# Patient Record
Sex: Female | Born: 1995 | Race: White | Hispanic: No | Marital: Single | Smoking: Former smoker
Health system: Southern US, Community
[De-identification: ages and names within clinical notes are randomized; demographics above are authoritative.]

## PROBLEM LIST (undated history)

## (undated) DIAGNOSIS — N390 Urinary tract infection, site not specified: Secondary | ICD-10-CM

## (undated) DIAGNOSIS — F1991 Other psychoactive substance use, unspecified, in remission: Secondary | ICD-10-CM

## (undated) DIAGNOSIS — B1920 Unspecified viral hepatitis C without hepatic coma: Secondary | ICD-10-CM

## (undated) DIAGNOSIS — Z87898 Personal history of other specified conditions: Secondary | ICD-10-CM

## (undated) HISTORY — DX: Personal history of other specified conditions: Z87.898

## (undated) HISTORY — DX: Other psychoactive substance use, unspecified, in remission: F19.91

## (undated) HISTORY — PX: SHOULDER SURGERY: SHX246

## (undated) HISTORY — DX: Urinary tract infection, site not specified: N39.0

---

## 2020-02-09 ENCOUNTER — Encounter (HOSPITAL_COMMUNITY): Payer: Self-pay

## 2020-02-09 ENCOUNTER — Other Ambulatory Visit: Payer: Self-pay

## 2020-02-09 ENCOUNTER — Emergency Department (HOSPITAL_COMMUNITY): Payer: Medicaid Other

## 2020-02-09 ENCOUNTER — Emergency Department (HOSPITAL_COMMUNITY)
Admission: EM | Admit: 2020-02-09 | Discharge: 2020-02-09 | Disposition: A | Discharge status: Home / Self Care | Payer: Medicaid Other | Attending: Emergency Medicine | Admitting: Emergency Medicine

## 2020-02-09 DIAGNOSIS — R102 Pelvic and perineal pain: Secondary | ICD-10-CM | POA: Diagnosis not present

## 2020-02-09 DIAGNOSIS — O99331 Smoking (tobacco) complicating pregnancy, first trimester: Secondary | ICD-10-CM | POA: Diagnosis not present

## 2020-02-09 DIAGNOSIS — F17210 Nicotine dependence, cigarettes, uncomplicated: Secondary | ICD-10-CM | POA: Diagnosis not present

## 2020-02-09 DIAGNOSIS — Z3A01 Less than 8 weeks gestation of pregnancy: Secondary | ICD-10-CM | POA: Insufficient documentation

## 2020-02-09 DIAGNOSIS — O99891 Other specified diseases and conditions complicating pregnancy: Secondary | ICD-10-CM | POA: Insufficient documentation

## 2020-02-09 DIAGNOSIS — N899 Noninflammatory disorder of vagina, unspecified: Secondary | ICD-10-CM | POA: Diagnosis not present

## 2020-02-09 DIAGNOSIS — R1033 Periumbilical pain: Secondary | ICD-10-CM | POA: Insufficient documentation

## 2020-02-09 DIAGNOSIS — O26891 Other specified pregnancy related conditions, first trimester: Secondary | ICD-10-CM | POA: Insufficient documentation

## 2020-02-09 HISTORY — DX: Unspecified viral hepatitis C without hepatic coma: B19.20

## 2020-02-09 LAB — CBC
HCT: 41.3 % (ref 36.0–46.0)
Hemoglobin: 13.7 g/dL (ref 12.0–15.0)
MCH: 31.2 pg (ref 26.0–34.0)
MCHC: 33.2 g/dL (ref 30.0–36.0)
MCV: 94.1 fL (ref 80.0–100.0)
Platelets: 244 10*3/uL (ref 150–400)
RBC: 4.39 MIL/uL (ref 3.87–5.11)
RDW: 12.9 % (ref 11.5–15.5)
WBC: 6.7 10*3/uL (ref 4.0–10.5)
nRBC: 0 % (ref 0.0–0.2)

## 2020-02-09 LAB — WET PREP, GENITAL
Clue Cells Wet Prep HPF POC: NONE SEEN
Sperm: NONE SEEN
Trich, Wet Prep: NONE SEEN
Yeast Wet Prep HPF POC: NONE SEEN

## 2020-02-09 LAB — I-STAT BETA HCG BLOOD, ED (MC, WL, AP ONLY): I-stat hCG, quantitative: 1449.5 m[IU]/mL — ABNORMAL HIGH (ref <5)

## 2020-02-09 LAB — URINALYSIS, ROUTINE W REFLEX MICROSCOPIC
Bilirubin Urine: NEGATIVE
Glucose, UA: NEGATIVE mg/dL
Hgb urine dipstick: NEGATIVE
Ketones, ur: NEGATIVE mg/dL
Leukocytes,Ua: NEGATIVE
Nitrite: NEGATIVE
Protein, ur: NEGATIVE mg/dL
Specific Gravity, Urine: 1.024 (ref 1.005–1.030)
pH: 6 (ref 5.0–8.0)

## 2020-02-09 LAB — COMPREHENSIVE METABOLIC PANEL
ALT: 34 U/L (ref 0–44)
AST: 33 U/L (ref 15–41)
Albumin: 4.2 g/dL (ref 3.5–5.0)
Alkaline Phosphatase: 79 U/L (ref 38–126)
Anion gap: 7 (ref 5–15)
BUN: 17 mg/dL (ref 6–20)
CO2: 28 mmol/L (ref 22–32)
Calcium: 9.6 mg/dL (ref 8.9–10.3)
Chloride: 106 mmol/L (ref 98–111)
Creatinine, Ser: 0.64 mg/dL (ref 0.44–1.00)
GFR, Estimated: >60 mL/min (ref >60)
Glucose, Bld: 83 mg/dL (ref 70–99)
Potassium: 3.7 mmol/L (ref 3.5–5.1)
Sodium: 141 mmol/L (ref 135–145)
Total Bilirubin: 0.4 mg/dL (ref 0.3–1.2)
Total Protein: 7.1 g/dL (ref 6.5–8.1)

## 2020-02-09 LAB — HCG, QUANTITATIVE, PREGNANCY: hCG, Beta Chain, Quant, S: 2223 m[IU]/mL — ABNORMAL HIGH (ref <5)

## 2020-02-09 LAB — LIPASE, BLOOD: Lipase: 28 U/L (ref 11–51)

## 2020-02-09 NOTE — ED Triage Notes (Signed)
Patient c/o leakage from her navel and abdominal tenderness x 2 days. Patient states she has generalized body aches since waking this AM.

## 2020-02-09 NOTE — Discharge Instructions (Addendum)
You were provided with a copy of your ultrasound report, you will need to follow-up with the [facility] in order to obtain a repeat hCG, blood work in 1 week.  If you experience any worsening pain, vaginal bleeding or other complaints please return to the ED.

## 2020-02-09 NOTE — ED Provider Notes (Signed)
[facility]-EMERGENCY DEPT Provider Note   CSN: [id number] Arrival date & time: 02/09/20  1028     History Chief Complaint  Patient presents with  . leakage from navel  . Abdominal Pain    Abigail Pittman is a 24 y.o. female.  24 y.o female with a PMH of complete miscarriage presents to the ED with a chief complaint of abdominal tenderness for the past 2 days. She reports noticing discharge from her navel, states this has been ongoing for the past 2 days, reports she thought that she likely did not clean her navel, however is unsure what color the discharge was. She also reports some lower pelvic tenderness, states she has had the symptoms of "mild pelvic cramping like a menstrual cycle". Reports she is currently sexually active, unsure whether she is pregnant. States she woke up today with body aches, periumbilical pain, this is dull but worse with touch. Her last menstrual cycle was on January 09, 2020. She also endorses some nausea this morning however no vomiting episodes. No prior history of surgical intervention to her abdomen. Does report she had a previous miscarriage at [age], she had been using drugs back then however reports not using any drugs recently. She does endorse tobacco use. No fevers, vaginal bleeding, no vaginal discharge, no urinary complaints.     The history is provided by the patient.  Abdominal Pain Pain location:  Periumbilical Pain quality: dull   Pain radiates to:  Does not radiate Pain severity:  Mild Onset quality:  Gradual Duration:  2 days Timing:  Intermittent Progression:  Unchanged Associated symptoms: no chest pain, no fever, no nausea, no shortness of breath, no sore throat, no vaginal bleeding, no vaginal discharge and no vomiting        Past Medical History:  Diagnosis Date  . Hepatitis C     There are no problems to display for this patient.   Past Surgical History:  Procedure Laterality Date  . SHOULDER  SURGERY Left      OB History   No obstetric history on file.     Family History  Problem Relation Age of Onset  . Diverticulitis Mother     Social History   Tobacco Use  . Smoking status: Current Every Day Smoker    Packs/day: 1.00    Types: Cigarettes  . Smokeless tobacco: Never Used  Vaping Use  . Vaping Use: Never used  Substance Use Topics  . Alcohol use: Not Currently  . Drug use: Not Currently    Home Medications Prior to Admission medications   Not on File    Allergies    Penicillins  Review of Systems   Review of Systems  Constitutional: Negative for fever.  HENT: Negative for sinus pressure and sore throat.   Respiratory: Negative for shortness of breath.   Cardiovascular: Negative for chest pain.  Gastrointestinal: Positive for abdominal pain. Negative for nausea and vomiting.  Genitourinary: Negative for flank pain, urgency, vaginal bleeding, vaginal discharge and vaginal pain.  Musculoskeletal: Negative for back pain.  Neurological: Negative for light-headedness and headaches.  All other systems reviewed and are negative.   Physical Exam Updated Vital Signs BP 95/64   Pulse 80   Temp 98.6 F (37 C) (Oral)   Resp 15   Ht 5' 5.5" (1.664 m)   Wt 74.8 kg   LMP 01/09/2020   SpO2 98%   BMI 27.04 kg/m   Physical Exam Vitals and nursing note reviewed. Exam  conducted with a chaperone present.  Constitutional:      Appearance: She is well-developed. She is not ill-appearing.  HENT:     Head: Normocephalic and atraumatic.  Cardiovascular:     Rate and Rhythm: Normal rate.  Pulmonary:     Effort: Pulmonary effort is normal.     Breath sounds: No wheezing or rales.  Abdominal:     General: Abdomen is flat. Bowel sounds are normal.     Palpations: Abdomen is soft.     Tenderness: There is abdominal tenderness in the periumbilical area. There is no right CVA tenderness, left CVA tenderness or guarding.     Hernia: There is no hernia in the  umbilical area.     Comments: Very mild tenderness to the pelvic region.  Genitourinary:    Exam position: Supine.     Pubic Area: No rash.      Vagina: Vaginal discharge present.     Cervix: Normal.     Adnexa: Right adnexa normal and left adnexa normal.     Comments: Significant amount of white creamy discharge on vaginal vault.no CMT.  Chaperone Automotive engineer at the bedside. Skin:    General: Skin is warm and dry.     Comments: No periumbilical erythema, no drainage noted.   Neurological:     Mental Status: She is alert.     ED Results / Procedures / Treatments   Labs (all labs ordered are listed, but only abnormal results are displayed) Labs Reviewed  WET PREP, GENITAL - Abnormal; Notable for the following components:      Result Value   WBC, Wet Prep HPF POC FEW (*)    All other components within normal limits  URINALYSIS, ROUTINE W REFLEX MICROSCOPIC - Abnormal; Notable for the following components:   APPearance HAZY (*)    All other components within normal limits  HCG, QUANTITATIVE, PREGNANCY - Abnormal; Notable for the following components:   hCG, Beta Chain, Quant, S 2,223 (*)    All other components within normal limits  I-STAT BETA HCG BLOOD, ED ([facility], [facility], AP ONLY) - Abnormal; Notable for the following components:   I-stat hCG, quantitative 1,449.5 (*)    All other components within normal limits  LIPASE, BLOOD  COMPREHENSIVE METABOLIC PANEL  CBC  GC/CHLAMYDIA PROBE AMP ([place]) NOT AT [facility]    EKG None  Radiology US OB LESS THAN 14 WEEKS W/ OB TRANSVAGINAL AND DOPPLER  Result Date: 02/09/2020 CLINICAL DATA:  24 year old pregnant female with pelvic pain. Quantitative beta HCG 1449.5. EDC by LMP: 10/15/2020, projecting to an expected gestational age of [redacted] weeks 3 days. EXAM: OBSTETRIC <14 WK [place] AND TRANSVAGINAL OB US DOPPLER ULTRASOUND OF OVARIES TECHNIQUE: Both transabdominal and transvaginal ultrasound examinations were performed for complete evaluation of the  gestation as well as the maternal uterus, adnexal regions, and pelvic cul-de-sac. Transvaginal technique was performed to assess early pregnancy. Color and duplex Doppler ultrasound was utilized to evaluate blood flow to the ovaries. COMPARISON:  None. FINDINGS: Intrauterine gestational sac: Single tiny intrauterine sac-like structure. Yolk sac:  Not Visualized. Embryo:  Not Visualized. Cardiac Activity: Not Visualized. MSD: 2.5 mm   5 w   0 d Subchorionic hemorrhage:  None visualized. Maternal uterus/adnexae: No uterine fibroids. Right ovary measures 3.1 x 1.6 x 2.5 cm. Left ovary measures 3.1 x 1.5 x 1.9 cm. No suspicious ovarian or adnexal masses. No abnormal free fluid in the pelvis. Pulsed Doppler evaluation of both ovaries demonstrates normal appearing low-resistance  arterial and venous waveforms. IMPRESSION: 1. Single tiny intrauterine sac-like structure at 5 weeks 0 days by mean sac diameter. No definitive features of pregnancy such as a yolk sac or embryo at this time, which could be due to early gestational age. Recommend follow-up quantitative B-HCG levels and follow-up obstetric scan in 11-14 days to assess viability. This recommendation follows SRU consensus guidelines: Diagnostic Criteria for Nonviable Pregnancy Early in the First Trimester. Malva Limes Med 2013; 659:9357-01. 2. Normal ovaries. No evidence of adnexal torsion. No adnexal masses. Electronically Signed   By: Delbert Phenix M.D.   On: 02/09/2020 17:18    Procedures Procedures (including critical care time)  Medications Ordered in ED Medications - No data to display  ED Course  I have reviewed the triage vital signs and the nursing notes.  Pertinent labs & imaging results that were available during my care of the patient were reviewed by me and considered in my medical decision making (see chart for details).  Clinical Course as of 02/09/20 Paulo Fruit  Thu Feb 09, 2020  1558 I-stat hCG, quantitative(!): 1,449.5 Last menstrual cycle  01/09/2020 [JS]  1717 HCG, Beta Chain, Quant, S(!): 2,223 [JS]  1806 WBC, Wet Prep HPF POC(!): FEW [JS]    Clinical Course User Index [JS] Claude Manges, PA-C   MDM Rules/Calculators/A&P  Patient with a PMH of miscarriage presents to the ED with a chief complaint of abdominal tenderness and periumbilical drainage along with pelvic pain. Reports symptoms have been ongoing for the past 2 days, they worsened today and she was unable to attend work. She presents to the ED with stable vital signs, has not had any fever, no vaginal discharge, no urinary symptoms.  Abdomen is soft, mild tender to palpation on exam, auscultation with bowel sounds present. Interpretation of her labs showed a CMP without any electrolyte derangement, creatinine levels within normal limits. LFTs are within normal limits. Lipase level is within normal limits. CBC without any leukocytosis. Urinalysis hazy appearance, no signs of infection. Beta HCG was positive at 1,449.5.    OB ultrasound showed: 1. Single tiny intrauterine sac-like structure at 5 weeks 0 days by  mean sac diameter. No definitive features of pregnancy such as a  yolk sac or embryo at this time, which could be due to early  gestational age. Recommend follow-up quantitative B-HCG levels and  follow-up obstetric scan in 11-14 days to assess viability. This  recommendation follows SRU consensus guidelines: Diagnostic Criteria  for Nonviable Pregnancy Early in the First Trimester. Malva Limes Med  2013; 779:3903-00.  2. Normal ovaries. No evidence of adnexal torsion. No adnexal  masses.     We discussed results of ultrasound at length, she is aware that she will need a repeat hCG in about a week in order to determine if pregnancy is viable.  She does have a previous history of miscarriage, reports he has no insurance to cover this and therefore we will send her to the women's clinic.  She is otherwise in no discomfort.  Pelvic exam without CMT, no adnexal  tenderness, have a lower suspicion for torsion or ectopic.   Wet prep with wbc, no trichomonas although a prior history of STI.  GC and Chlamydia are currently pending.  She was provided with a copy of her ultrasound report.  She will also need to go to women's health care at med center in order to obtain a repeat hCG.  Patient is agreeable of treatment at this time.  Patient understands and agrees with management, return precautions discussed at length.  Portions of this note were generated with Scientist, clinical (histocompatibility and immunogenetics). Dictation errors may occur despite best attempts at proofreading.  Final Clinical Impression(s) / ED Diagnoses Final diagnoses:  Less than [redacted] weeks gestation of pregnancy    Rx / DC Orders ED Discharge Orders    None       Claude Manges, Cordelia Poche 02/09/20 Micheal Likens, MD 02/14/20 1019

## 2020-02-09 NOTE — ED Notes (Signed)
Pelvic cart at pt bedside 

## 2020-02-10 LAB — GC/CHLAMYDIA PROBE AMP (~~LOC~~) NOT AT ARMC
Chlamydia: NEGATIVE
Comment: NEGATIVE
Comment: NORMAL
Neisseria Gonorrhea: NEGATIVE

## 2020-02-17 ENCOUNTER — Encounter: Payer: Self-pay | Admitting: *Deleted

## 2020-02-17 ENCOUNTER — Other Ambulatory Visit: Payer: Self-pay

## 2020-02-17 ENCOUNTER — Encounter: Payer: Self-pay | Admitting: Family Medicine

## 2020-02-17 ENCOUNTER — Encounter (HOSPITAL_COMMUNITY): Payer: Self-pay | Admitting: Obstetrics and Gynecology

## 2020-02-17 ENCOUNTER — Ambulatory Visit (INDEPENDENT_AMBULATORY_CARE_PROVIDER_SITE_OTHER): Payer: Self-pay | Admitting: Family Medicine

## 2020-02-17 ENCOUNTER — Inpatient Hospital Stay (HOSPITAL_COMMUNITY)
Admission: AD | Admit: 2020-02-17 | Discharge: 2020-02-17 | Disposition: A | Discharge status: Home / Self Care | Payer: Medicaid Other | Attending: Obstetrics and Gynecology | Admitting: Obstetrics and Gynecology

## 2020-02-17 ENCOUNTER — Inpatient Hospital Stay (HOSPITAL_COMMUNITY): Payer: Medicaid Other

## 2020-02-17 VITALS — BP 115/58 | HR 94 | Wt 159.8 lb

## 2020-02-17 DIAGNOSIS — O3680X0 Pregnancy with inconclusive fetal viability, not applicable or unspecified: Secondary | ICD-10-CM | POA: Insufficient documentation

## 2020-02-17 DIAGNOSIS — O99331 Smoking (tobacco) complicating pregnancy, first trimester: Secondary | ICD-10-CM | POA: Insufficient documentation

## 2020-02-17 DIAGNOSIS — R109 Unspecified abdominal pain: Secondary | ICD-10-CM

## 2020-02-17 DIAGNOSIS — O26891 Other specified pregnancy related conditions, first trimester: Secondary | ICD-10-CM

## 2020-02-17 DIAGNOSIS — O200 Threatened abortion: Secondary | ICD-10-CM | POA: Insufficient documentation

## 2020-02-17 DIAGNOSIS — Z3490 Encounter for supervision of normal pregnancy, unspecified, unspecified trimester: Secondary | ICD-10-CM

## 2020-02-17 DIAGNOSIS — Z3A01 Less than 8 weeks gestation of pregnancy: Secondary | ICD-10-CM

## 2020-02-17 DIAGNOSIS — F17210 Nicotine dependence, cigarettes, uncomplicated: Secondary | ICD-10-CM | POA: Diagnosis not present

## 2020-02-17 HISTORY — DX: Threatened abortion: O20.0

## 2020-02-17 LAB — COMPREHENSIVE METABOLIC PANEL WITH GFR
ALT: 39 U/L (ref 0–44)
AST: 35 U/L (ref 15–41)
Albumin: 3.9 g/dL (ref 3.5–5.0)
Alkaline Phosphatase: 79 U/L (ref 38–126)
Anion gap: 9 (ref 5–15)
BUN: 12 mg/dL (ref 6–20)
CO2: 25 mmol/L (ref 22–32)
Calcium: 9.5 mg/dL (ref 8.9–10.3)
Chloride: 103 mmol/L (ref 98–111)
Creatinine, Ser: 0.74 mg/dL (ref 0.44–1.00)
GFR, Estimated: >60 mL/min
Glucose, Bld: 96 mg/dL (ref 70–99)
Potassium: 3.7 mmol/L (ref 3.5–5.1)
Sodium: 137 mmol/L (ref 135–145)
Total Bilirubin: 0.6 mg/dL (ref 0.3–1.2)
Total Protein: 6.9 g/dL (ref 6.5–8.1)

## 2020-02-17 LAB — CBC
HCT: 39.8 % (ref 36.0–46.0)
Hemoglobin: 13.6 g/dL (ref 12.0–15.0)
MCH: 31.2 pg (ref 26.0–34.0)
MCHC: 34.2 g/dL (ref 30.0–36.0)
MCV: 91.3 fL (ref 80.0–100.0)
Platelets: 258 10*3/uL (ref 150–400)
RBC: 4.36 MIL/uL (ref 3.87–5.11)
RDW: 12.7 % (ref 11.5–15.5)
WBC: 7 10*3/uL (ref 4.0–10.5)
nRBC: 0 % (ref 0.0–0.2)

## 2020-02-17 LAB — ABO/RH: ABO/RH(D): A NEG

## 2020-02-17 LAB — BETA HCG QUANT (REF LAB): hCG Quant: 24387 m[IU]/mL

## 2020-02-17 NOTE — MAU Note (Signed)
Sent from office for R/O ectopic pregnancy.  Patient denies any cramping or bleeding.  LMP 01/09/2020.

## 2020-02-17 NOTE — Progress Notes (Addendum)
GYNECOLOGY OFFICE VISIT NOTE  History:   Abigail Pittman is a 24 y.o. G3P0010 here today for follow up of early pregnancy.  Patient seen in [facility] ED on with abdominal pain Had beta hcg at that time of 2,223 and a TVUS showing intrauterine sac like structure at [redacted]w[redacted]d size w/o yolk sac or embryo Has not had any abdominal pain or bleeding in that time  Past Medical History:  Diagnosis Date  . Hepatitis C     Past Surgical History:  Procedure Laterality Date  . SHOULDER SURGERY Left     The following portions of the patient's history were reviewed and updated as appropriate: allergies, current medications, past family history, past medical history, past social history, past surgical history and problem list.   Health Maintenance:  Normal pap and negative HRHPV: none documented.  Normal mammogram: n/a.   Review of Systems:  Pertinent items noted in HPI and remainder of comprehensive ROS otherwise negative.  Physical Exam:  BP (!) 115/58   Pulse 94   Wt 159 lb 12.8 oz (72.5 kg)   LMP 01/09/2020 (Exact Date)   BMI 26.19 kg/m  CONSTITUTIONAL: Well-developed, well-nourished female in no acute distress.  HEENT:  Normocephalic, atraumatic. External right and left ear normal. No scleral icterus.  NECK: Normal range of motion, supple, no masses noted on observation SKIN: No rash noted. Not diaphoretic. No erythema. No pallor. MUSCULOSKELETAL: Normal range of motion. No edema noted. NEUROLOGIC: Alert and oriented to person, place, and time. Normal muscle tone coordination.  PSYCHIATRIC: Normal mood and affect. Normal behavior. Normal judgment and thought content. RESPIRATORY: Effort normal, no problems with respiration noted ABDOMEN: No masses noted. No other overt distention noted.   PELVIC: Deferred  Labs and Imaging No results found for this or any previous visit (from the past 168 hour(s)). US OB LESS THAN 14 WEEKS W/ OB TRANSVAGINAL AND DOPPLER  Result Date:  02/09/2020 CLINICAL DATA:  24 year old pregnant female with pelvic pain. Quantitative beta HCG 1449.5. EDC by LMP: 10/15/2020, projecting to an expected gestational age of [redacted] weeks 3 days. EXAM: OBSTETRIC <14 WK [place] AND TRANSVAGINAL OB US DOPPLER ULTRASOUND OF OVARIES TECHNIQUE: Both transabdominal and transvaginal ultrasound examinations were performed for complete evaluation of the gestation as well as the maternal uterus, adnexal regions, and pelvic cul-de-sac. Transvaginal technique was performed to assess early pregnancy. Color and duplex Doppler ultrasound was utilized to evaluate blood flow to the ovaries. COMPARISON:  None. FINDINGS: Intrauterine gestational sac: Single tiny intrauterine sac-like structure. Yolk sac:  Not Visualized. Embryo:  Not Visualized. Cardiac Activity: Not Visualized. MSD: 2.5 mm   5 w   0 d Subchorionic hemorrhage:  None visualized. Maternal uterus/adnexae: No uterine fibroids. Right ovary measures 3.1 x 1.6 x 2.5 cm. Left ovary measures 3.1 x 1.5 x 1.9 cm. No suspicious ovarian or adnexal masses. No abnormal free fluid in the pelvis. Pulsed Doppler evaluation of both ovaries demonstrates normal appearing low-resistance arterial and venous waveforms. IMPRESSION: 1. Single tiny intrauterine sac-like structure at 5 weeks 0 days by mean sac diameter. No definitive features of pregnancy such as a yolk sac or embryo at this time, which could be due to early gestational age. Recommend follow-up quantitative B-HCG levels and follow-up obstetric scan in 11-14 days to assess viability. This recommendation follows SRU consensus guidelines: Diagnostic Criteria for Nonviable Pregnancy Early in the First Trimester. Malva Limes Med 2013; 829:9371-69. 2. Normal ovaries. No evidence of adnexal torsion. No adnexal masses. Electronically Signed  By: Delbert PhenixJason A Poff M.D.   On: 02/09/2020 17:18       Admission on 02/09/2020, Discharged on 02/09/2020  Component Date Value Ref Range Status  . Lipase  02/09/2020 28  11 - 51 U/L Final   Performed at [facility], [address] [place], [place] [address]  . Sodium 02/09/2020 141  135 - 145 mmol/L Final  . Potassium 02/09/2020 3.7  3.5 - 5.1 mmol/L Final  . Chloride 02/09/2020 106  98 - 111 mmol/L Final  . CO2 02/09/2020 28  22 - 32 mmol/L Final  . Glucose, Bld 02/09/2020 83  70 - 99 mg/dL Final   Glucose reference range applies only to samples taken after fasting for at least 8 hours.  . BUN 02/09/2020 17  6 - 20 mg/dL Final  . Creatinine, Ser 02/09/2020 0.64  0.44 - 1.00 mg/dL Final  . Calcium 81/19/147812/11/2019 9.6  8.9 - 10.3 mg/dL Final  . Total Protein 02/09/2020 7.1  6.5 - 8.1 g/dL Final  . Albumin 29/56/213012/11/2019 4.2  3.5 - 5.0 g/dL Final  . AST 86/57/846912/11/2019 33  15 - 41 U/L Final  . ALT 02/09/2020 34  0 - 44 U/L Final  . Alkaline Phosphatase 02/09/2020 79  38 - 126 U/L Final  . Total Bilirubin 02/09/2020 0.4  0.3 - 1.2 mg/dL Final  . GFR, Estimated 02/09/2020 >60  >60 mL/min Final   Comment: (NOTE) Calculated using the CKD-EPI Creatinine Equation (2021)   . Anion gap 02/09/2020 7  5 - 15 Final   Performed at [facility], [address] [place], [place] [address]  . WBC 02/09/2020 6.7  4.0 - 10.5 K/uL Final  . RBC 02/09/2020 4.39  3.87 - 5.11 MIL/uL Final  . Hemoglobin 02/09/2020 13.7  12.0 - 15.0 g/dL Final  . HCT 84/13/244012/11/2019 41.3  36.0 - 46.0 % Final  . MCV 02/09/2020 94.1  80.0 - 100.0 fL Final  . MCH 02/09/2020 31.2  26.0 - 34.0 pg Final  . MCHC 02/09/2020 33.2  30.0 - 36.0 g/dL Final  . RDW 10/27/253612/11/2019 12.9  11.5 - 15.5 % Final  . Platelets 02/09/2020 244  150 - 400 K/uL Final  . nRBC 02/09/2020 0.0  0.0 - 0.2 % Final   Performed at [facility], [address] [place], [place] [address]  . Color, Urine 02/09/2020 YELLOW  YELLOW Final  . APPearance 02/09/2020 HAZY* CLEAR Final  . Specific Gravity, Urine 02/09/2020 1.024  1.005 - 1.030 Final  . pH 02/09/2020 6.0  5.0 - 8.0  Final  . Glucose, UA 02/09/2020 NEGATIVE  NEGATIVE mg/dL Final  . Hgb urine dipstick 02/09/2020 NEGATIVE  NEGATIVE Final  . Bilirubin Urine 02/09/2020 NEGATIVE  NEGATIVE Final  . Ketones, ur 02/09/2020 NEGATIVE  NEGATIVE mg/dL Final  . Protein, ur 47/42/595612/11/2019 NEGATIVE  NEGATIVE mg/dL Final  . Nitrite 38/75/643312/11/2019 NEGATIVE  NEGATIVE Final  . Glori LuisLeukocytes,Ua 02/09/2020 NEGATIVE  NEGATIVE Final   Performed at [facility], [address] [place], [place] [address]  . I-stat hCG, quantitative 02/09/2020 1,449.5* <5 mIU/mL Final  . Comment 3 02/09/2020          Final   Comment:   GEST. AGE      CONC.  (mIU/mL)   <=1 WEEK        5 - 50     2 WEEKS       50 - 500     3 WEEKS  100 - 10,000     4 WEEKS     1,000 - 30,000        FEMALE AND NON-PREGNANT FEMALE:     LESS THAN 5 mIU/mL   . Yeast Wet Prep HPF POC 02/09/2020 NONE SEEN  NONE SEEN Final  . Trich, Wet Prep 02/09/2020 NONE SEEN  NONE SEEN Final  . Clue Cells Wet Prep HPF POC 02/09/2020 NONE SEEN  NONE SEEN Final  . WBC, Wet Prep HPF POC 02/09/2020 FEW* NONE SEEN Final   Specimen diluted due to transport tube containing more than 1 ml of saline, interpret results with caution.  Marland Kitchen Sperm 02/09/2020 NONE SEEN   Final   Performed at [facility], [address], [place], [place] [address]  . Neisseria Gonorrhea 02/09/2020 Negative   Final  . Chlamydia 02/09/2020 Negative   Final  . Comment 02/09/2020 Normal Reference Ranger Chlamydia - Negative   Final  . Comment 02/09/2020 Normal Reference Range Neisseria Gonorrhea - Negative   Final  . hCG, Beta Chain, Quant, S 02/09/2020 2,223* <5 mIU/mL Final   Comment:          GEST. AGE      CONC.  (mIU/mL)   <=1 WEEK        5 - 50     2 WEEKS       50 - 500     3 WEEKS       100 - 10,000     4 WEEKS     1,000 - 30,000     5 WEEKS     3,500 - 115,000   6-8 WEEKS     12,000 - 270,000    12 WEEKS     15,000 - 220,000        FEMALE AND NON-PREGNANT FEMALE:      LESS THAN 5 mIU/mL Performed at [facility], [address] [place], [place] [address]    Pt informed that the ultrasound is considered a limited OB ultrasound and is not intended to be a complete ultrasound exam.  Patient also informed that the ultrasound is not being completed with the intent of assessing for fetal or placental anomalies or any pelvic abnormalities.  Explained that the purpose of today's ultrasound is to assess for  viability.  Patient acknowledges the purpose of the exam and the limitations of the study.    My interpretation: transabdominal scan performed in [place] room, endometrial strip seen in both sagittal and transverse planes without any structures visualized  Assessment and Plan:   Problem List Items Addressed This Visit      Other   Threatened miscarriage - Primary    Patient presenting for follow up of early pregnancy. On BSUS no intrauterine structures seen, hcg still pending. Discussed that miscarriage is possible given lack of structures but will need to get another beta checked to help determine next steps.  12:56 PM Addendum: Patient's hcg uptrended from [phone]. Given lack of any intrauterine findings on BSUS this is concerning for ectopic pregnancy vs loss. Spoke with patient and informed her of results, she was instructed to present to MAU for repeat US and further evaluation, she will go there immediately. Signout provided to MAU staff as well.       Relevant Orders   Beta hCG quant (ref lab) (Completed)      Routine preventative health maintenance measures emphasized. Please refer to After Visit Summary for other counseling recommendations.  Return once results of beta-hcg test have returned.    Total face-to-face time with patient: 20 minutes.  Over 50% of encounter was spent on counseling and coordination of care.   Venora Maples, MD/[facility] for [employer], [facility]

## 2020-02-17 NOTE — Assessment & Plan Note (Addendum)
Patient presenting for follow up of early pregnancy. On BSUS no intrauterine structures seen, hcg still pending. Discussed that miscarriage is possible given lack of structures but will need to get another beta checked to help determine next steps.  12:56 PM Addendum: Patient's hcg uptrended from [phone]. Given lack of any intrauterine findings on BSUS this is concerning for ectopic pregnancy vs loss. Spoke with patient and informed her of results, she was instructed to present to MAU for repeat US and further evaluation, she will go there immediately. Signout provided to MAU staff as well.

## 2020-02-17 NOTE — MAU Provider Note (Signed)
History     CSN: [id number]  Arrival date and time: 02/17/20 1338   Event Date/Time   First Provider Initiated Contact with Patient 02/17/20 1442      Chief Complaint  Patient presents with  . Abdominal Pain   24 y.o. [id number] @[redacted]w[redacted]d  sent from office for inconclusive fetal viability. She was originally seen in the [facility] on 12/9 had a qhcg of 2000 and 2001 showed only IUGS. She was seen at the office today for f/u and had qhcg of 24k and no IUP was seen by abdominal US. She was sent here for imaging. She denies VB or pain.   OB History    Gravida  3   Para  0   Term  0   Preterm  0   AB  1   Living  0     SAB  1   IAB  0   Ectopic  0   Multiple  0   Live Births  0           Past Medical History:  Diagnosis Date  . Hepatitis C     Past Surgical History:  Procedure Laterality Date  . SHOULDER SURGERY Left     Family History  Problem Relation Age of Onset  . Diverticulitis Mother     Social History   Tobacco Use  . Smoking status: Current Some Day Smoker    Packs/day: 1.00    Types: Cigarettes  . Smokeless tobacco: Never Used  Vaping Use  . Vaping Use: Some days  Substance Use Topics  . Alcohol use: Not Currently  . Drug use: Not Currently    Allergies:  Allergies  Allergen Reactions  . Penicillins     No medications prior to admission.    Review of Systems  Gastrointestinal: Negative for abdominal pain.  Genitourinary: Negative for vaginal bleeding.   Physical Exam   Blood pressure 103/75, pulse (!) 103, temperature 98.3 F (36.8 C), resp. rate 15, last menstrual period 01/09/2020.  Physical Exam Vitals and nursing note reviewed.  Constitutional:      General: She is not in acute distress.    Appearance: Normal appearance.  HENT:     Head: Normocephalic and atraumatic.  Pulmonary:     Effort: Pulmonary effort is normal. No respiratory distress.  Musculoskeletal:     Cervical back: Normal range of motion.  Neurological:      General: No focal deficit present.     Mental Status: She is alert and oriented to person, place, and time.  Psychiatric:        Mood and Affect: Mood normal.        Behavior: Behavior normal.    Results for orders placed or performed during the hospital encounter of 02/17/20 (from the past 24 hour(s))  CBC     Status: None   Collection Time: 02/17/20  2:00 PM  Result Value Ref Range   WBC 7.0 4.0 - 10.5 K/uL   RBC 4.36 3.87 - 5.11 MIL/uL   Hemoglobin 13.6 12.0 - 15.0 g/dL   HCT 02/19/20 74.2 - 59.5 %   MCV 91.3 80.0 - 100.0 fL   MCH 31.2 26.0 - 34.0 pg   MCHC 34.2 30.0 - 36.0 g/dL   RDW 63.8 75.6 - 43.3 %   Platelets 258 150 - 400 K/uL   nRBC 0.0 0.0 - 0.2 %  Comprehensive metabolic panel     Status: None   Collection Time: 02/17/20  2:00 PM  Result Value Ref Range   Sodium 137 135 - 145 mmol/L   Potassium 3.7 3.5 - 5.1 mmol/L   Chloride 103 98 - 111 mmol/L   CO2 25 22 - 32 mmol/L   Glucose, Bld 96 70 - 99 mg/dL   BUN 12 6 - 20 mg/dL   Creatinine, Ser 7.62 0.44 - 1.00 mg/dL   Calcium 9.5 8.9 - 26.3 mg/dL   Total Protein 6.9 6.5 - 8.1 g/dL   Albumin 3.9 3.5 - 5.0 g/dL   AST 35 15 - 41 U/L   ALT 39 0 - 44 U/L   Alkaline Phosphatase 79 38 - 126 U/L   Total Bilirubin 0.6 0.3 - 1.2 mg/dL   GFR, Estimated >33 >54 mL/min   Anion gap 9 5 - 15  ABO/Rh     Status: None   Collection Time: 02/17/20  2:00 PM  Result Value Ref Range   ABO/RH(D)      A NEG Performed at [facility], [address] [place], [place] [address]    [place] Transvaginal  Result Date: 02/17/2020 CLINICAL DATA:  Pregnant patient with pelvic pain. EXAM: TRANSVAGINAL OB ULTRASOUND TECHNIQUE: Transvaginal ultrasound was performed for complete evaluation of the gestation as well as the maternal uterus, adnexal regions, and pelvic cul-de-sac. COMPARISON:  Pelvic ultrasound 02/09/2020. FINDINGS: Intrauterine gestational sac: Single. Yolk sac:  Visualized. Embryo:  Not visualized. Cardiac Activity: Not  applicable. MSD: 15 mm   6 w   2 d Subchorionic hemorrhage:  None visualized. Maternal uterus/adnexae: Negative. IMPRESSION: Findings most consistent with early intrauterine pregnancy. No acute abnormality. Electronically Signed   By: Drusilla Kanner M.D.   On: 02/17/2020 16:03   MAU Course  Procedures  MDM Labs and [place] ordered and reviewed. Early IUP on [place], no FP. Plan for f/u US in 10 days for viability.   Assessment and Plan   1. Early stage of pregnancy    Discharge home Follow up at [facility] in 10 days for [place]- ordered SAB precautions Start PNV daily  Allergies as of 02/17/2020      Reactions   Penicillins       Medication List    You have not been prescribed any medications.    Donette Larry, CNM 02/17/2020, 2:49 PM

## 2020-03-03 DIAGNOSIS — U071 COVID-19: Secondary | ICD-10-CM

## 2020-03-03 HISTORY — DX: COVID-19: U07.1

## 2020-03-03 NOTE — L&D Delivery Note (Signed)
LABOR COURSE Patient presented for a post-dates IOL on 8/22. Induction was started at 1713 with cytotec. A foley bulb was placed around 1900 but was removed shortly thereafter due to patient discomfort/preference. She received a second dose of cytotec at 2111. Pitocin was initiated at 0242. AROM at 0900. She progressed to complete with pitocin infusion and was completely dilated at 1430.   Delivery Note Called to room and patient was complete and pushing. Head delivered OA. A single nuchal cord was present which was delivered through and reduced. Shoulder and body delivered in usual fashion. At 1531 a healthy female was delivered via Vaginal, Spontaneous, vertex presentation.  Infant with spontaneous cry, placed on mother's abdomen, dried and stimulated. Cord clamped x 2 after 1-2-minute delay, and cut by FOB. Cord blood drawn. Placenta delivered spontaneously with gentle cord traction. Appears intact. Fundus firm with massage and Pitocin. Labia, perineum, vagina, and cervix inspected with a superficial R labial tear not requiring repair.    APGAR: 8,9 ; weight  pending.   Cord: 3VC with the following complications: none.  Anesthesia:  Epidural Episiotomy: None Lacerations: Superficial R labial Est. Blood Loss (mL): 250  Mom to postpartum.  Baby to Couplet care / Skin to Skin.  Alicia Amel, MD 10/23/20 4:08 PM

## 2020-03-05 ENCOUNTER — Ambulatory Visit: Payer: Medicaid Other

## 2020-03-05 ENCOUNTER — Other Ambulatory Visit: Payer: Self-pay

## 2020-03-05 ENCOUNTER — Telehealth: Payer: Self-pay | Admitting: Medical

## 2020-03-05 ENCOUNTER — Ambulatory Visit
Admission: RE | Admit: 2020-03-05 | Discharge: 2020-03-05 | Disposition: A | Discharge status: Home / Self Care | Payer: Medicaid Other | Source: Ambulatory Visit | Attending: Certified Nurse Midwife | Admitting: Certified Nurse Midwife

## 2020-03-05 DIAGNOSIS — Z3490 Encounter for supervision of normal pregnancy, unspecified, unspecified trimester: Secondary | ICD-10-CM | POA: Insufficient documentation

## 2020-03-05 NOTE — Telephone Encounter (Signed)
I called Abigail Pittman today at 12:40 PM and confirmed patient's identity using two patient identifiers. [place] results from earlier today were reviewed. Patient is not yet scheduled for new OB visit. She would like to be seen at [facility], an inbasket message was sent to the admin pool to schedule a new OB appointment in ~ 4 weeks, they will contact the patient with that appointment. First trimester warning signs reviewed. Patient voiced understanding and had no further questions.   US OB Transvaginal  Result Date: 03/05/2020 CLINICAL DATA:  Inconclusive fetal viability. Positive pregnancy test. EXAM: OBSTETRIC <14 WK [place] AND TRANSVAGINAL OB US TECHNIQUE: Both transabdominal and transvaginal ultrasound examinations were performed for complete evaluation of the gestation as well as the maternal uterus, adnexal regions, and pelvic cul-de-sac. Transvaginal technique was performed to assess early pregnancy. COMPARISON:  02/17/2020 FINDINGS: Intrauterine gestational sac: Single Yolk sac:  Visualized. Embryo:  Visualized. Cardiac Activity: Visualized. Heart Rate: 171 bpm CRL: 16.3 mm   8 w   0 d                  [place] EDC: 10/15/2020 Subchorionic hemorrhage:  None visualized. Maternal uterus/adnexae: Maternal ovaries unremarkable. No free fluid in the cul-de-sac. IMPRESSION: Single living intrauterine pregnancy at estimated 8 week 0 day gestational age by crown-rump length. Electronically Signed   By: Kennith Center M.D.   On: 03/05/2020 11:00    Vonzella Nipple, PA-C 03/05/2020 12:40 PM

## 2020-03-14 ENCOUNTER — Emergency Department (HOSPITAL_COMMUNITY)
Admission: EM | Admit: 2020-03-14 | Discharge: 2020-03-14 | Disposition: A | Discharge status: Home / Self Care | Payer: HRSA Program | Attending: Emergency Medicine | Admitting: Emergency Medicine

## 2020-03-14 ENCOUNTER — Other Ambulatory Visit: Payer: Self-pay

## 2020-03-14 DIAGNOSIS — O98511 Other viral diseases complicating pregnancy, first trimester: Secondary | ICD-10-CM | POA: Diagnosis not present

## 2020-03-14 DIAGNOSIS — U071 COVID-19: Secondary | ICD-10-CM | POA: Diagnosis not present

## 2020-03-14 DIAGNOSIS — Z3A10 10 weeks gestation of pregnancy: Secondary | ICD-10-CM | POA: Diagnosis not present

## 2020-03-14 DIAGNOSIS — Z20822 Contact with and (suspected) exposure to covid-19: Secondary | ICD-10-CM

## 2020-03-14 DIAGNOSIS — O99331 Smoking (tobacco) complicating pregnancy, first trimester: Secondary | ICD-10-CM | POA: Insufficient documentation

## 2020-03-14 DIAGNOSIS — F17210 Nicotine dependence, cigarettes, uncomplicated: Secondary | ICD-10-CM | POA: Diagnosis not present

## 2020-03-14 DIAGNOSIS — O26891 Other specified pregnancy related conditions, first trimester: Secondary | ICD-10-CM | POA: Diagnosis present

## 2020-03-14 LAB — POC SARS CORONAVIRUS 2 AG -  ED: SARS Coronavirus 2 Ag: NEGATIVE

## 2020-03-14 LAB — SARS CORONAVIRUS 2 (TAT 6-24 HRS): SARS Coronavirus 2: POSITIVE — AB

## 2020-03-14 NOTE — ED Notes (Signed)
Patient verbalizes understanding of discharge instructions. Opportunity for questioning and answers were provided. Armband removed by staff, pt discharged from ED and ambulated to lobby to go home with significant other.

## 2020-03-14 NOTE — Discharge Instructions (Addendum)
Your COVID test is currently pending, please see the result through MyChart, plan below.  If test positive, please follow instruction below.  Return if you develop significant shortness of breath.  Recommendations for at home COVID-19 symptoms management:  Please continue isolation at home. Call [phone] to see whether you might be eligible for therapeutic antibody infusions (leave your name and they will call you back).  If have acute worsening of symptoms please go to ER/urgent care for further evaluation. Check pulse oximetry and if below 90-92% please go to ER. The following supplements MAY help:  Vitamin C 500mg  twice a day and Quercetin 250-500 mg twice a day Vitamin D3 2000 - 4000 u/day B Complex vitamins Zinc 75-100 mg/day Melatonin 6-10 mg at night (the optimal dose is unknown)

## 2020-03-14 NOTE — ED Triage Notes (Signed)
Pt with nasal congestion since yesterday and woke up this morning with body aches, headache, and fever of 100.2. [redacted] weeks pregnant. Took 2 tylenol just PTA.

## 2020-03-14 NOTE — ED Provider Notes (Signed)
[facility] EMERGENCY DEPARTMENT Provider Note   CSN: [id number] Arrival date & time: 03/14/20  4098     History No chief complaint on file.   Abigail Pittman is a 25 y.o. female.  The history is provided by the patient. No language interpreter was used.     25 year old G93, P77 female significant history of hepatitis C, currently [redacted] weeks pregnant presenting with cold symptoms.  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  Yesterday she developed some nasal congestion and not feeling well and this morning she woke up with body aches, headache, and a temperature of 100.2.  She took 2 Tylenol's and decided to come to the ER to get tested for COVID.  She has not been vaccinated for COVID-19.  She denies loss of taste or smell no shortness of breath no neck stiffness no dysuria or rash.  She reports a confirmed IUP on ultrasound. she does not complain of any abdominal pain  Past Medical History:  Diagnosis Date  . Hepatitis C     Patient Active Problem List   Diagnosis Date Noted  . Threatened miscarriage 02/17/2020    Past Surgical History:  Procedure Laterality Date  . SHOULDER SURGERY Left      OB History    Gravida  3   Para  0   Term  0   Preterm  0   AB  1   Living  0     SAB  1   IAB  0   Ectopic  0   Multiple  0   Live Births  0           Family History  Problem Relation Age of Onset  . Diverticulitis Mother     Social History   Tobacco Use  . Smoking status: Current Some Day Smoker    Packs/day: 1.00    Types: Cigarettes  . Smokeless tobacco: Never Used  Vaping Use  . Vaping Use: Some days  Substance Use Topics  . Alcohol use: Not Currently  . Drug use: Not Currently    Home Medications Prior to Admission medications   Not on File    Allergies    Penicillins  Review of Systems   Review of Systems  All other systems  reviewed and are negative.   Physical Exam Updated Vital Signs BP 118/76 (BP Location: Right Arm)   Pulse 96   Temp 98.3 F (36.8 C) (Oral)   Resp 16   LMP 01/09/2020 (Exact Date)   SpO2 98%   Physical Exam Vitals and nursing note reviewed.  Constitutional:      General: She is not in acute distress.    Appearance: She is well-developed and well-nourished.  HENT:     Head: Atraumatic.  Eyes:     Conjunctiva/sclera: Conjunctivae normal.  Cardiovascular:     Rate and Rhythm: Normal rate and regular rhythm.     Pulses: Normal pulses.     Heart sounds: Normal heart sounds.  Pulmonary:     Effort: Pulmonary effort is normal.     Breath sounds: Normal breath sounds. No wheezing, rhonchi or rales.  Abdominal:     Palpations: Abdomen is soft.     Tenderness: There is no abdominal tenderness.  Musculoskeletal:     Cervical back: Neck supple.  Skin:    Findings: No rash.  Neurological:     Mental Status:  She is alert and oriented to person, place, and time.  Psychiatric:        Mood and Affect: Mood and affect and mood normal.     ED Results / Procedures / Treatments   Labs (all labs ordered are listed, but only abnormal results are displayed) Labs Reviewed  SARS CORONAVIRUS 2 (TAT 6-24 HRS)  POC SARS CORONAVIRUS 2 AG -  ED    EKG None  Radiology No results found.  Procedures Procedures (including critical care time)  Medications Ordered in ED Medications - No data to display  ED Course  I have reviewed the triage vital signs and the nursing notes.  Pertinent labs & imaging results that were available during my care of the patient were reviewed by me and considered in my medical decision making (see chart for details).    MDM Rules/Calculators/A&P                          BP 118/76 (BP Location: Right Arm)   Pulse 96   Temp 98.3 F (36.8 C) (Oral)   Resp 16   LMP 01/09/2020 (Exact Date)   SpO2 98%   Final Clinical Impression(s) / ED  Diagnoses Final diagnoses:  Suspected COVID-19 virus infection    Rx / DC Orders ED Discharge Orders    None     10:17 AM Patient presents with cold symptoms concerning for potential COVID infection.  Initial rapid antigen COVID test obtained is negative.  Will obtain a PCR test.  Patient otherwise well-appearing, afebrile, no hypoxia, and lungs are clear on auscultation.  She is stable for discharge home with appropriate work note, care instruction and return precaution.  Strongly urged patient to get vaccinated for COVID-19.  Abigail Pittman was evaluated in Emergency Department on 03/14/2020 for the symptoms described in the history of present illness. She was evaluated in the context of the global COVID-19 pandemic, which necessitated consideration that the patient might be at risk for infection with the SARS-CoV-2 virus that causes COVID-19. Institutional protocols and algorithms that pertain to the evaluation of patients at risk for COVID-19 are in a state of rapid change based on information released by regulatory bodies including the CDC and federal and state organizations. These policies and algorithms were followed during the patient's care in the ED.    Fayrene Helper, PA-C 03/14/20 1021    Pollyann Savoy, MD 03/14/20 1329

## 2020-03-27 ENCOUNTER — Telehealth (INDEPENDENT_AMBULATORY_CARE_PROVIDER_SITE_OTHER): Payer: Self-pay | Admitting: *Deleted

## 2020-03-27 ENCOUNTER — Other Ambulatory Visit: Payer: Self-pay

## 2020-03-27 DIAGNOSIS — F1991 Other psychoactive substance use, unspecified, in remission: Secondary | ICD-10-CM

## 2020-03-27 DIAGNOSIS — Z87898 Personal history of other specified conditions: Secondary | ICD-10-CM

## 2020-03-27 DIAGNOSIS — O0990 Supervision of high risk pregnancy, unspecified, unspecified trimester: Secondary | ICD-10-CM | POA: Insufficient documentation

## 2020-03-27 DIAGNOSIS — Z8616 Personal history of COVID-19: Secondary | ICD-10-CM

## 2020-03-27 DIAGNOSIS — Z8619 Personal history of other infectious and parasitic diseases: Secondary | ICD-10-CM

## 2020-03-27 DIAGNOSIS — U071 COVID-19: Secondary | ICD-10-CM | POA: Insufficient documentation

## 2020-03-27 NOTE — Progress Notes (Addendum)
New OB Intake  I connected with  Abigail Pittman on 03/27/20 at 8:25 by telephone and verified that I am speaking with the correct person using two identifiers. Nurse is located at [facility] and pt is located at home.  She attempted to connect virtually and was unable to do so.    I discussed the limitations, risks, security and privacy concerns of performing an evaluation and management service by telephone and the availability of in person appointments. I also discussed with the patient that there may be a patient responsible charge related to this service. The patient expressed understanding and agreed to proceed.  I explained I am completing New OB Intake today. We discussed her EDD of 10/15/2020 that is based on LMP of 01/09/20. Pt is G2/P0010. I reviewed her allergies, medications, Medical/Surgical/OB history, and appropriate screenings. I informed her of [facility] services. 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 . She is in a program with Narcotics Anonymous.   Concerns addressed today  History +COVID 03/14/20. No symptoms now.   Delivery Plans:  Plans to deliver at [facility].   MyChart/Babyscripts MyChart access verified. I explained pt will have some visits in office and some virtually. Babyscripts instructions given. Account successfully created and app downloaded.  Blood Pressure Cuff  Patient does not have ability to do virtual visits. Does not  Have bp cuff. Has applied for Medicaid.   Anatomy US Explained first scheduled [place] will be around 19 weeks. Explained I will schedule  Anatomy US and she will be notified by MyChart.   Labs Discussed [name] genetic screening with patient. Would like both Panorama and Horizon drawn at new OB visit. Routine prenatal labs needed.  Covid Vaccine Patient has not covid vaccine.   [facility] Referral Patient is interested in referral to [facility].    First visit review I reviewed new OB appt with pt. I  explained she will have a pelvic exam, ob bloodwork with genetic screening, and PAP smear. Explained pt will be seen by Steward Drone, CNM at first visit; encounter routed to appropriate provider. I offered monthly new patient  Zoom meeting  Abigail Bradburn,RN 03/27/2020  8:27 AM

## 2020-03-27 NOTE — Patient Instructions (Signed)
- At our [facility], we work as an integrated team, providing care to address both physical and emotional health. Your medical provider may refer you to see our Behavioral Health Clinician (BHC) on the same day you see your medical provider, as availability permits; often scheduled virtually at your convenience.  Our BHC is available to all patients, visits generally last between 20-30 minutes, but can be longer or shorter, depending on patient need. The BHC offers help with stress management, coping with symptoms of depression and anxiety, major life changes , sleep issues, changing risky behavior, grief and loss, life stress, working on personal life goals, and  behavioral health issues, as these all affect your overall health and wellness.  The BHC is NOT available for the following: FMLA paperwork, court-ordered evaluations, specialty assessments (custody or disability), letters to employers, or obtaining certification for an emotional support animal. The BHC does not provide long-term therapy. You have the right to refuse integrated behavioral health services, or to reschedule to see the BHC at a later date.  Confidentiality exception: If it is suspected that a child or disabled adult is being abused or neglected, we are required by law to report that to either Child Protective Services or Adult Protective Services.  If you have a diagnosis of Bipolar affective disorder, Schizophrenia, or recurrent Major depressive disorder, we will recommend that you establish care with a psychiatrist, as these are lifelong, chronic conditions, and we want your overall emotional health and medications to be more closely monitored. If you anticipate needing extended maternity leave due to mental health issues postpartum, it it recommended you inform your medical provider, so we can put in a referral to a  psychiatrist as soon as possible. The BHC is unable to recommend an extended maternity leave for mental  health issues. Your medical provider or BHC may refer you to a therapist for ongoing, traditional therapy, or to a psychiatrist, for medication management, if it would benefit your overall health. Depending on your insurance, you may have a copay to see the BHC. If you are uninsured, it is recommended that you apply for financial assistance. (Forms may be requested at the front desk for in-person visits, via MyChart, or request a form during a virtual visit).  If you see the BHC more than 6 times, you will have to complete a comprehensive clinical assessment interview with the BHC to resume integrated services.  For virtual visits with the BHC, you must be physically in the state of [place] at the time of the visit. For example, if you live in [place], you will have to do an in-person visit with the BHC, and your out-of-state insurance may not cover behavioral health services in [place]. f you are going out of the state or country for any reason, the BHC may see you virtually when you return to [place], but not while you are physically outside of [place].     Meet the Provider Zoom Sessions      [facility] is now offering FREE monthly 1-hour virtual Zoom sessions for new, current, and prospective patients.        During these sessions, you can:   Learn about our practice, model of care, services   Get answers to questions about pregnancy and birth during COVID   Pick your provider's brain about anything else!    Sessions will be hosted by [facility] Nurse Practitioners, Physician Assistants, Physicians and Midwives            No registration required      2021 Dates:      All at 6pm     October 21st     November 18th   December 16th     January 20th  February 17th    To join one of these meetings, a few minutes before it is set to start:     Copy/paste the link into your web  browser:  [email or web address]    OR  Scan the QR code below (open up your camera and point towards QR code; click on tab that pops up on your phone ("zoom")    

## 2020-03-27 NOTE — Addendum Note (Signed)
Addended by: Gerome Apley on: 03/27/2020 04:25 PM   Modules accepted: Orders

## 2020-04-02 ENCOUNTER — Telehealth: Payer: Self-pay | Admitting: *Deleted

## 2020-04-02 ENCOUNTER — Encounter: Payer: Self-pay | Admitting: *Deleted

## 2020-04-02 ENCOUNTER — Other Ambulatory Visit: Payer: Self-pay

## 2020-04-02 ENCOUNTER — Ambulatory Visit (INDEPENDENT_AMBULATORY_CARE_PROVIDER_SITE_OTHER): Payer: Medicaid Other | Admitting: Certified Nurse Midwife

## 2020-04-02 ENCOUNTER — Encounter: Payer: Self-pay | Admitting: Certified Nurse Midwife

## 2020-04-02 ENCOUNTER — Other Ambulatory Visit (HOSPITAL_COMMUNITY)
Admission: RE | Admit: 2020-04-02 | Discharge: 2020-04-02 | Disposition: A | Discharge status: Home / Self Care | Payer: Medicaid Other | Source: Ambulatory Visit | Attending: Certified Nurse Midwife | Admitting: Certified Nurse Midwife

## 2020-04-02 VITALS — BP 111/59 | HR 86 | Wt 162.9 lb

## 2020-04-02 DIAGNOSIS — F1991 Other psychoactive substance use, unspecified, in remission: Secondary | ICD-10-CM

## 2020-04-02 DIAGNOSIS — Z3A12 12 weeks gestation of pregnancy: Secondary | ICD-10-CM

## 2020-04-02 DIAGNOSIS — Z87898 Personal history of other specified conditions: Secondary | ICD-10-CM

## 2020-04-02 DIAGNOSIS — O0990 Supervision of high risk pregnancy, unspecified, unspecified trimester: Secondary | ICD-10-CM | POA: Diagnosis not present

## 2020-04-02 DIAGNOSIS — Z8619 Personal history of other infectious and parasitic diseases: Secondary | ICD-10-CM

## 2020-04-02 DIAGNOSIS — U071 COVID-19: Secondary | ICD-10-CM

## 2020-04-02 DIAGNOSIS — O98511 Other viral diseases complicating pregnancy, first trimester: Secondary | ICD-10-CM

## 2020-04-02 LAB — POCT URINALYSIS DIP (DEVICE)
Bilirubin Urine: NEGATIVE
Glucose, UA: NEGATIVE mg/dL
Hgb urine dipstick: NEGATIVE
Ketones, ur: NEGATIVE mg/dL
Leukocytes,Ua: NEGATIVE
Nitrite: NEGATIVE
Protein, ur: NEGATIVE mg/dL
Specific Gravity, Urine: >=1.03 (ref 1.005–1.030)
Urobilinogen, UA: 1 mg/dL (ref 0.0–1.0)
pH: 5.5 (ref 5.0–8.0)

## 2020-04-02 NOTE — Progress Notes (Signed)
History:   Abigail Pittman is a 25 y.o. G2P0010 at [redacted]w[redacted]d by LMP being seen today for her first obstetrical visit.  Her obstetrical history is significant for history of drug use and Hepatitis C. Patient does intend to breast feed. Pregnancy history fully reviewed.  Patient reports no complaints.     HISTORY: OB History  Gravida Para Term Preterm AB Living  2 0 0 0 1 0  SAB IAB Ectopic Multiple Live Births  1 0 0 0 0    # Outcome Date GA Lbr Len/2nd Weight Sex Delivery Anes PTL Lv  2 Current           1 SAB 06/2014             Birth Comments: did not go to doctor, had + home pregnancy test, then had a lot of bleeding /passed tissue, then had negative pregnancy test     Past Medical History:  Diagnosis Date  . COVID-19 03/2020  . Hepatitis C   . History of drug use   . UTI (urinary tract infection)    Past Surgical History:  Procedure Laterality Date  . SHOULDER SURGERY Left    Family History  Problem Relation Age of Onset  . Diverticulitis Mother   . Pancreatic cancer Father   . Skin cancer Father   . Colon cancer Father    Social History   Tobacco Use  . Smoking status: Former Smoker    Packs/day: 1.00    Types: Cigarettes    Quit date: 02/09/2020    Years since quitting: 0.1  . Smokeless tobacco: Never Used  Vaping Use  . Vaping Use: Former  . Quit date: 08/26/2019  Substance Use Topics  . Alcohol use: Not Currently  . Drug use: Not Currently    Types: Marijuana, Methamphetamines, Heroin    Comment: 03/27/20 6 months free   Allergies  Allergen Reactions  . Penicillins Hives   Current Outpatient Medications on File Prior to Visit  Medication Sig Dispense Refill  . acetaminophen (TYLENOL) 325 MG tablet Take 650 mg by mouth every 6 (six) hours as needed.    . Prenatal Vit-Fe Fumarate-FA (PRENATAL VITAMINS PO) Take 1 tablet by mouth daily.     No current facility-administered medications on file prior to visit.    Review of Systems Pertinent  items noted in HPI and remainder of comprehensive ROS otherwise negative.  Physical Exam:   Vitals:   04/02/20 0909  BP: (!) 111/59  Pulse: 86  Weight: 162 lb 14.4 oz (73.9 kg)   Fetal Heart Rate (bpm): 153  General: well-developed, well-nourished female in no acute distress  Breasts:  normal appearance, no masses or tenderness bilaterally  Skin: normal coloration and turgor, no rashes  Neurologic: oriented, normal, negative, normal mood  Extremities: normal strength, tone, and muscle mass, ROM of all joints is normal  HEENT PERRLA, extraocular movement intact and sclera clear  Neck supple and no masses  Cardiovascular: regular rate and rhythm  Respiratory:  no respiratory distress, normal breath sounds  Abdomen: soft, non-tender; bowel sounds normal; no masses,  no organomegaly  Pelvic: normal external genitalia, no lesions, normal vaginal mucosa, moderate amount of white thin discharge without odor, normal cervix, pap smear done.     Assessment:    Pregnancy: G2P0010 Patient Active Problem List   Diagnosis Date Noted  . Supervision of high risk pregnancy, antepartum 03/27/2020  . History of drug use 03/27/2020  . COVID-19 affecting pregnancy in  first trimester 03/27/2020  . History of hepatitis C 03/27/2020  . Threatened miscarriage 02/17/2020     Plan:    1. Supervision of high risk pregnancy, antepartum - Welcomed to practice and introduced self to patient  - Reviewed safety, visitor policy, reassurance about COVID-19 for pregnancy at this time. Discussed possible changes to visits, including televisits, that may occur due to COVID-19.  The office remains open if pt needs to be seen and MAU is open 24 hours/day for OB emergencies. - Anticipatory guidance on upcoming appointments  - CBC/D/Plt+RPR+Rh+ABO+Rub Ab... - Genetic Screening - Culture, OB Urine - Cytology - PAP( [place]) - [place] MFM OB COMP + 14 WK; Future - Cervicovaginal ancillary only( [place])  2.  History of drug use - Patient reports that she has been clean since 08/2019, went to [facility]  - Hx of THC and methamphetamines  - Urine drugs of abuse scrn w alc, routine ([facility] [place] [facility])  3. COVID-19 affecting pregnancy in first trimester - +03/14/20 - Patient reports full recovery, denies any symptoms of cold, cough or congestion   4. History of hepatitis C - HCV RNA quant   Initial labs drawn. Continue prenatal vitamins. Problem list reviewed and updated. Genetic Screening discussed, NIPS: ordered. Ultrasound discussed; fetal anatomic survey: ordered. Anticipatory guidance about prenatal visits given including labs, ultrasounds, and testing. Discussed usage of Babyscripts and virtual visits as additional source of managing and completing prenatal visits in midst of coronavirus and pandemic.   Encouraged to complete MyChart Registration for her ability to review results, send requests, and have questions addressed.  The nature of  - Center for [facility] with multiple MDs and Advanced Practice Providers was explained to patient; also emphasized that residents, students are part of our team. Routine obstetric precautions reviewed. Encouraged to seek out care at office or emergency room [facility]U preferred) for urgent and/or emergent concerns. Return in about 4 weeks (around 04/30/2020) for HROB, in person, AFP.     Sharyon Cable, CNM Center for [employer], [facility]

## 2020-04-02 NOTE — Progress Notes (Signed)
Here for new ob visit. Had intake previously. Medicaid pending. Gave new ob packet.  Will order bp cuff when medicaid approved.  Genetic testing being done today with ob bloodwork. Timithy Arons,RN

## 2020-04-02 NOTE — Patient Instructions (Signed)

## 2020-04-02 NOTE — Telephone Encounter (Signed)
Pt left VM message stating that she has a question and would like a call back.

## 2020-04-03 ENCOUNTER — Other Ambulatory Visit: Payer: Self-pay

## 2020-04-03 LAB — CYTOLOGY - PAP: Diagnosis: NEGATIVE

## 2020-04-03 LAB — URINE DRUGS OF ABUSE SCREEN W ALC, ROUTINE (REF LAB)
Amphetamines, Urine: NEGATIVE ng/mL
Barbiturate Quant, Ur: NEGATIVE ng/mL
Benzodiazepine Quant, Ur: NEGATIVE ng/mL
Cannabinoid Quant, Ur: NEGATIVE ng/mL
Cocaine (Metab.): NEGATIVE ng/mL
Ethanol, Urine: NEGATIVE %
Methadone Screen, Urine: NEGATIVE ng/mL
Opiate Quant, Ur: NEGATIVE ng/mL
PCP Quant, Ur: NEGATIVE ng/mL
Propoxyphene: NEGATIVE ng/mL

## 2020-04-03 LAB — CERVICOVAGINAL ANCILLARY ONLY
Bacterial Vaginitis (gardnerella): NEGATIVE
Candida Glabrata: NEGATIVE
Candida Vaginitis: POSITIVE — AB
Chlamydia: NEGATIVE
Comment: NEGATIVE
Comment: NEGATIVE
Comment: NEGATIVE
Comment: NEGATIVE
Comment: NEGATIVE
Comment: NORMAL
Neisseria Gonorrhea: NEGATIVE
Trichomonas: NEGATIVE

## 2020-04-03 MED ORDER — TERCONAZOLE 0.4 % VA CREA: 1.0000 | TOPICAL_CREAM | Freq: Every day | VAGINAL | 0 refills | Status: DC

## 2020-04-03 NOTE — Telephone Encounter (Signed)
Called patient stating I am returning her phone call. Patient states she is planning a gender reveal that involves shooting things and wants to know if it is okay for her to operate a AR-15 while pregnant. Patient states the gun doesn't have kickback or recoil. Told patient since it doesn't have kickback she should be fine to operate as long as she takes the necessary precautions. Patient verbalized understanding & had no questions.

## 2020-04-04 LAB — CULTURE, OB URINE

## 2020-04-04 LAB — URINE CULTURE, OB REFLEX

## 2020-04-06 LAB — HCV RNA QUANT
HCV log10: 6.439 log10 IU/mL
Hepatitis C Quantitation: 2750000 IU/mL

## 2020-04-06 LAB — CBC/D/PLT+RPR+RH+ABO+RUB AB...
Antibody Screen: NEGATIVE
Basophils Absolute: 0 10*3/uL (ref 0.0–0.2)
Basos: 0 %
EOS (ABSOLUTE): 0.1 10*3/uL (ref 0.0–0.4)
Eos: 2 %
HCV Ab: 6.5 s/co ratio — ABNORMAL HIGH (ref 0.0–0.9)
HIV Screen 4th Generation wRfx: NONREACTIVE
Hematocrit: 40.8 % (ref 34.0–46.6)
Hemoglobin: 14.2 g/dL (ref 11.1–15.9)
Hepatitis B Surface Ag: NEGATIVE
Immature Grans (Abs): 0 10*3/uL (ref 0.0–0.1)
Immature Granulocytes: 0 %
Lymphocytes Absolute: 2.5 10*3/uL (ref 0.7–3.1)
Lymphs: 40 %
MCH: 31.1 pg (ref 26.6–33.0)
MCHC: 34.8 g/dL (ref 31.5–35.7)
MCV: 90 fL (ref 79–97)
Monocytes Absolute: 0.6 10*3/uL (ref 0.1–0.9)
Monocytes: 9 %
Neutrophils Absolute: 3.1 10*3/uL (ref 1.4–7.0)
Neutrophils: 49 %
Platelets: 271 10*3/uL (ref 150–450)
RBC: 4.56 x10E6/uL (ref 3.77–5.28)
RDW: 11.9 % (ref 11.7–15.4)
RPR Ser Ql: NONREACTIVE
Rh Factor: NEGATIVE
Rubella Antibodies, IGG: 1.16 index (ref >0.99)
WBC: 6.4 10*3/uL (ref 3.4–10.8)

## 2020-04-06 LAB — INTERPRETATION:

## 2020-04-06 LAB — HCV RNA NAA QUALITATIVE: HCV RNA NAA QUALITATIVE: POSITIVE — AB

## 2020-04-27 ENCOUNTER — Encounter: Payer: Self-pay | Admitting: *Deleted

## 2020-05-02 ENCOUNTER — Encounter: Payer: Medicaid Other | Admitting: Nurse Practitioner

## 2020-05-04 ENCOUNTER — Encounter: Payer: Self-pay | Admitting: *Deleted

## 2020-05-17 ENCOUNTER — Other Ambulatory Visit: Payer: Self-pay | Admitting: Lactation Services

## 2020-05-17 MED ORDER — TERCONAZOLE 0.4 % VA CREA: 1.0000 | TOPICAL_CREAM | Freq: Every day | VAGINAL | 0 refills | Status: DC

## 2020-05-17 NOTE — Progress Notes (Signed)
Terazol ordered per Standing Order for thick white Vaginal discharge with vaginal itching and redness that has been present for 4-5 days.

## 2020-05-21 ENCOUNTER — Other Ambulatory Visit: Payer: Self-pay | Admitting: *Deleted

## 2020-05-21 ENCOUNTER — Other Ambulatory Visit: Payer: Self-pay

## 2020-05-21 ENCOUNTER — Ambulatory Visit: Payer: Medicaid Other | Attending: Certified Nurse Midwife

## 2020-05-21 ENCOUNTER — Encounter: Payer: Self-pay | Admitting: *Deleted

## 2020-05-21 ENCOUNTER — Ambulatory Visit: Payer: Medicaid Other | Admitting: *Deleted

## 2020-05-21 ENCOUNTER — Ambulatory Visit (INDEPENDENT_AMBULATORY_CARE_PROVIDER_SITE_OTHER): Payer: Medicaid Other | Admitting: Nurse Practitioner

## 2020-05-21 VITALS — BP 123/63 | HR 81 | Wt 174.4 lb

## 2020-05-21 DIAGNOSIS — Z8619 Personal history of other infectious and parasitic diseases: Secondary | ICD-10-CM

## 2020-05-21 DIAGNOSIS — Z87898 Personal history of other specified conditions: Secondary | ICD-10-CM | POA: Diagnosis not present

## 2020-05-21 DIAGNOSIS — O0990 Supervision of high risk pregnancy, unspecified, unspecified trimester: Secondary | ICD-10-CM | POA: Insufficient documentation

## 2020-05-21 DIAGNOSIS — Z8616 Personal history of COVID-19: Secondary | ICD-10-CM | POA: Insufficient documentation

## 2020-05-21 DIAGNOSIS — O43199 Other malformation of placenta, unspecified trimester: Secondary | ICD-10-CM

## 2020-05-21 DIAGNOSIS — O98511 Other viral diseases complicating pregnancy, first trimester: Secondary | ICD-10-CM | POA: Diagnosis present

## 2020-05-21 DIAGNOSIS — F1991 Other psychoactive substance use, unspecified, in remission: Secondary | ICD-10-CM

## 2020-05-21 DIAGNOSIS — Z3A19 19 weeks gestation of pregnancy: Secondary | ICD-10-CM

## 2020-05-21 DIAGNOSIS — U071 COVID-19: Secondary | ICD-10-CM | POA: Diagnosis present

## 2020-05-21 NOTE — Progress Notes (Signed)
Infectious Disease referral form filled out & given to Front desk to be faxed to [facility] with pertinent records.

## 2020-05-21 NOTE — Progress Notes (Addendum)
    Subjective:  Abigail Pittman is a 25 y.o. G2P0010 at [redacted]w[redacted]d being seen today for ongoing prenatal care.  She is currently monitored for the following issues for this high-risk pregnancy and has Threatened miscarriage; Supervision of high risk pregnancy, antepartum; History of drug use; COVID-19 affecting pregnancy in first trimester; and History of hepatitis C on their problem list.  Patient reports no complaints.  Contractions: Not present. Vag. Bleeding: None.  Movement: Present. Denies leaking of fluid.   The following portions of the patient's history were reviewed and updated as appropriate: allergies, current medications, past family history, past medical history, past social history, past surgical history and problem list. Problem list updated.  Objective:   Vitals:   05/21/20 1054  BP: 123/63  Pulse: 81  Weight: 174 lb 6.4 oz (79.1 kg)    Fetal Status: Fetal Heart Rate (bpm): 150   Movement: Present     General:  Alert, oriented and cooperative. Patient is in no acute distress.  Skin: Skin is warm and dry. No rash noted.   Cardiovascular: Normal heart rate noted  Respiratory: Normal respiratory effort, no problems with respiration noted  Abdomen: Soft, gravid, appropriate for gestational age. Pain/Pressure: Absent     Pelvic:  Cervical exam deferred        Extremities: Normal range of motion.  Edema: None  Mental Status: Normal mood and affect. Normal behavior. Normal judgment and thought content.   Urinalysis:      Assessment and Plan:  Pregnancy: G2P0010 at [redacted]w[redacted]d  1. Supervision of high risk pregnancy, antepartum Advised childbirth and breastfeeding classes Had [place] today Is feeling flutters of movement  - AFP, Serum, Open Spina Bifida - Ambulatory referral to Integrated Behavioral Health - Ambulatory referral to Infectious Disease  2. History of hepatitis C CMP done in December 2021 and was normal Has never had treatment for Hep C and was diagnosed  around age 72 HCV RNA quant is high - consult with Dr. Shawnie Pons and will refer to [facility] client to be aware of the referral and expect a call about an appointment.  - Ambulatory referral to Infectious Disease  3. History of COVID-19 Better now and is not planning to get the Covid vaccine  4. History of drug use Advised that behavioral health talks to all clients about depression in pregnancy Client has reported attending [facility] but is not in any counseling  - Ambulatory referral to Integrated Behavioral Health  OF NOTE:  Accompanying 25 year old vomited in exam room during the visit.  Preterm labor symptoms and general obstetric precautions including but not limited to vaginal bleeding, contractions, leaking of fluid and fetal movement were reviewed in detail with the patient. Please refer to After Visit Summary for other counseling recommendations.  Return in about 4 weeks (around 06/18/2020) for [facility] with MD.  Nolene Bernheim, RN, MSN, NP-BC Nurse Practitioner, [facility] for [facility], [facility] 05/21/2020 11:37 AM  Addendum: Referral sent to [facility] - they did not want to see her in pregnancy and asked that another referral be sent after delivery so she can be evaluated then.  Nolene Bernheim, RN, MSN, NP-BC Nurse Practitioner, [facility] for [employer], [facility] 05/21/2020 1:54 PM

## 2020-05-21 NOTE — Patient Instructions (Signed)
[email or web address] for childbirth and breastfeeding classes °

## 2020-05-22 ENCOUNTER — Encounter: Payer: Self-pay | Admitting: *Deleted

## 2020-05-22 NOTE — BH Specialist Note (Deleted)
Integrated Behavioral Health via Telemedicine Visit  05/22/2020 Abigail Pittman [id number]  Number of Integrated Behavioral Health visits: 1 Session Start time: 9:15***  Session End time: 10:15*** Total time: {IBH Total Time:21014050}  Referring Provider: *** Patient/Family location: Home*** [facility] Provider location: [facility]  All persons participating in visit: Patient *** and [facility] Shaquoya Cosper ***  Types of Service: {CHL AMB TYPE OF SERVICE:[phone]}  I connected with Theora Gianotti and/or Chauncey Mann Vilar's {family members:20773} via  Telephone or Video Enabled Telemedicine Application  (Video is Caregility application) and verified that I am speaking with the correct person using two identifiers. Discussed confidentiality: {YES/NO:21197}  I discussed the limitations of telemedicine and the availability of in person appointments.  Discussed there is a possibility of technology failure and discussed alternative modes of communication if that failure occurs.  I discussed that engaging in this telemedicine visit, they consent to the provision of behavioral healthcare and the services will be billed under their insurance.  Patient and/or legal guardian expressed understanding and consented to Telemedicine visit: {YES/NO:21197}  Presenting Concerns: Patient and/or family reports the following symptoms/concerns: *** Duration of problem: ***; Severity of problem: {Mild/Moderate/Severe:20260}  Patient and/or Family's Strengths/Protective Factors: {CHL AMB BH PROTECTIVE [phone]}  Goals Addressed: Patient will: 1.  Reduce symptoms of: {IBH Symptoms:21014056}  2.  Increase knowledge and/or ability of: {IBH Patient Tools:21014057}  3.  Demonstrate ability to: {IBH Goals:21014053}  Progress towards Goals: {CHL AMB BH PROGRESS TOWARDS GOALS:[phone]}  Interventions: Interventions utilized:  {IBH  Interventions:21014054} Standardized Assessments completed: {IBH Screening Tools:21014051}  Patient and/or Family Response: ***  Assessment: Patient currently experiencing ***.   Patient may benefit from ***.  Plan: 1. Follow up with behavioral health clinician on : *** 2. Behavioral recommendations: *** 3. Referral(s): {IBH Referrals:21014055}  I discussed the assessment and treatment plan with the patient and/or parent/guardian. They were provided an opportunity to ask questions and all were answered. They agreed with the plan and demonstrated an understanding of the instructions.   They were advised to call back or seek an in-person evaluation if the symptoms worsen or if the condition fails to improve as anticipated.  Valetta Close Twilia Yaklin, LCSW

## 2020-05-23 ENCOUNTER — Encounter: Payer: Self-pay | Admitting: Certified Nurse Midwife

## 2020-05-23 DIAGNOSIS — O43199 Other malformation of placenta, unspecified trimester: Secondary | ICD-10-CM | POA: Insufficient documentation

## 2020-05-29 ENCOUNTER — Telehealth: Payer: Self-pay | Admitting: Lactation Services

## 2020-05-29 LAB — AFP, SERUM, OPEN SPINA BIFIDA
AFP MoM: 0.89
AFP Value: 40.7 ng/mL
Gest. Age on Collection Date: 19 weeks
Maternal Age At EDD: 24 yr
OSBR Risk 1 IN: 10000
Test Results:: NEGATIVE
Weight: 174 [lb_av]

## 2020-05-29 NOTE — Telephone Encounter (Signed)
Lawan at [facility] called and needs patient weight to process AFP.   Called [facility] and was able to speak with Elease Hashimoto and gave weight of 174 pounds per chart review. AFP will be resulted.

## 2020-06-04 NOTE — BH Specialist Note (Addendum)
Integrated Behavioral Health via Telemedicine Visit  06/04/2020 [name] [id number]  Number of Integrated Behavioral Health visits: 1 Session Start time: 2:15  Session End time: 2:35 Total time: 20  Referring Provider: Nolene Bernheim, NP Patient/Family location: Home [facility] Provider location: Center for [facility] at [facility]  All persons participating in visit: [name] and Abigail Pittman   Types of Service: Individual psychotherapy and Video visit  I connected with Abigail Pittman and/or Abigail Pittman's n/a via  Telephone or Video Enabled Telemedicine Application  (Video is Caregility application) and verified that I am speaking with the correct person using two identifiers. Discussed confidentiality: Yes   I discussed the limitations of telemedicine and the availability of in person appointments.  Discussed there is a possibility of technology failure and discussed alternative modes of communication if that failure occurs.  I discussed that engaging in this telemedicine visit, they consent to the provision of behavioral healthcare and the services will be billed under their insurance.  Patient and/or legal guardian expressed understanding and consented to Telemedicine visit: Yes   Presenting Concerns: Patient and/or family reports the following symptoms/concerns: Pt states her primary symptom today is fatigue, attributed to working 10-12 hours, 6 days/week; has not used substances in over 8 months, is currently in 12-step program, has a sponsor, attends daily meetings; no additional questions or concerns at this time Duration of problem: Current pregnancy; Severity of problem: mild  Patient and/or Family's Strengths/Protective Factors: Social connections, Concrete supports in place (healthy food, safe environments, etc.), Sense of purpose and Physical Health (exercise, healthy diet, medication compliance,  etc.)  Goals Addressed: Patient will: 1.  Reduce symptoms of: stress  2.  Increase knowledge and/or ability of: stress reduction  3.  Demonstrate ability to: Increase healthy adjustment to current life circumstances  Progress towards Goals: Ongoing  Interventions: Interventions utilized:  Supportive Counseling, Psychoeducation and/or Health Education and Link to [employer] Standardized Assessments completed: Not Needed  Patient and/or Family Response: Pt agrees to treatment plan  Assessment: Patient currently experiencing Psychosocial stress and History of substance use disorder  Patient may benefit from psychoeducation and brief therapeutic interventions regarding coping with symptoms of current life stress .  Plan: 1. Follow up with behavioral health clinician on : Call Elison Worrel at [phone] as needed 2. 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 Referral(s): Integrated Art gallery manager (In Clinic) and [employer]:  Childcare; Postpartum planner  I discussed the assessment and treatment plan with the patient and/or parent/guardian. They were provided an opportunity to ask questions and all were answered. They agreed with the plan and demonstrated an understanding of the instructions.   They were advised to call back or seek an in-person evaluation if the symptoms worsen or if the condition fails to improve as anticipated.  KRIPA FOSKEY, LCSW   Depression screen [facility] 2/9 05/23/2020 04/02/2020 03/27/2020 02/17/2020  Decreased Interest 0 0 0 2  Down, Depressed, Hopeless 0 0 0 0  PHQ - 2 Score 0 0 0 2  Altered sleeping 0 1 0 1  Tired, decreased energy 2 2 1 3   Change in appetite 0 0 0 2  Feeling bad or failure about yourself  0 0 0 0   Trouble concentrating 0 0 0 0  Moving slowly or  fidgety/restless 0 0 0 0  Suicidal thoughts 0 0 0 0  PHQ-9 Score 2 3 1 8    GAD 7 : Generalized Anxiety Score 05/23/2020 04/02/2020 03/27/2020 02/17/2020  Nervous, Anxious, on Edge 1 0 0 0  Control/stop worrying 1 1 0 2  Worry too much - different things 1 1 0 1  Trouble relaxing 0 0 0 0  Restless 0 0 0 0  Easily annoyed or irritable 1 2 1 3   Afraid - awful might happen 0 0 0 1  Total GAD 7 Score 4 4 1  7

## 2020-06-12 ENCOUNTER — Ambulatory Visit (INDEPENDENT_AMBULATORY_CARE_PROVIDER_SITE_OTHER): Payer: Medicaid Other | Admitting: Clinical

## 2020-06-12 DIAGNOSIS — Z658 Other specified problems related to psychosocial circumstances: Secondary | ICD-10-CM | POA: Diagnosis not present

## 2020-06-12 DIAGNOSIS — O0990 Supervision of high risk pregnancy, unspecified, unspecified trimester: Secondary | ICD-10-CM

## 2020-06-12 DIAGNOSIS — Z87898 Personal history of other specified conditions: Secondary | ICD-10-CM

## 2020-06-12 NOTE — Patient Instructions (Signed)
Center for [facility] at [facility] [place], [place] [address] [phone] (main office) [phone] ([place] office)   [place] Child Psychologist, counselling  (Childcare options, Early childcare development, etc.) [email or web address]  [facility] site (virtual tour of hospital; register for childbirth classes, etc.) [email or web address]      BRAINSTORMING  Develop a Plan Goals: . Provide a way to start conversation about your new life with a baby . Assist parents in recognizing and using resources within their reach . Help pave the way before birth for an easier period of transition afterwards.  Make a list of the following information to keep in a central location: . Full name of Mom and Partner: _____________________________________________ . 14 full name and Date of Birth: ___________________________________________ . Home Address: ___________________________________________________________ ________________________________________________________________________ . Home Phone: ____________________________________________________________ . Parents' cell numbers: _____________________________________________________ ________________________________________________________________________ . Name and contact info for OB: ______________________________________________ . Name and contact info for Pediatrician:________________________________________ . Contact info for Lactation Consultants: ________________________________________  REST and SLEEP *You each need at least 4-5 hours of uninterrupted sleep every day. Write specific names and contact information.* . How are you going to rest in the postpartum period? While partner's home? When partner returns to work? When you both return to work? Marland Kitchen Where will your baby sleep? Marland Kitchen Who is available to help during the day? Evening? Night? . Who could move in for a period to help  support you? Marland Kitchen What are some ideas to help you get enough sleep? __________________________________________________________________________________________________________________________________________________________________________________________________________________________________________ NUTRITIOUS FOOD AND DRINK *Plan for meals before your baby is born so you can have healthy food to eat during the immediate postpartum period.* . Who will look after breakfast? Lunch? Dinner? List names and contact information. Brainstorm quick, healthy ideas for each meal. . What can you do before baby is born to prepare meals for the postpartum period? . How can others help you with meals? Marland Kitchen Which grocery stores provide online shopping and delivery? Marland Kitchen Which restaurants offer take-out or delivery options? ______________________________________________________________________________________________________________________________________________________________________________________________________________________________________________________________________________________________________________________________________________________________________________________________________  CARE FOR MOM *It's important that mom is cared for and pampered in the postpartum period. Remember, the most important ways new mothers need care are: sleep, nutrition, gentle exercise, and time off.* . Who can come take care of mom during this period? Make a list of people with their contact information. . List some activities that make you feel cared for, rested, and energized? Who can make sure you have opportunities to do these things? . Does mom have a space of her very own within your home that's just for her? Make a "[facility]" where she can be comfortable, rest, and renew herself  daily. ______________________________________________________________________________________________________________________________________________________________________________________________________________________________________________________________________________________________________________________________________________________________________________________________________    CARE FOR AND FEEDING BABY *Knowledgeable and encouraging people will offer the best support with regard to feeding your baby.* . Educate yourself and choose the best feeding option for your baby. . Make a list of people who will guide, support, and be a resource for you as your care for and feed your baby. (Friends that have breastfed or are currently breastfeeding, lactation consultants, breastfeeding support groups, etc.) . Consider a postpartum doula. (These websites can give you information: dona.org & BuyingShow.es) . Seek out local breastfeeding resources like the breastfeeding support group at [employer] or [employer]. ______________________________________________________________________________________________________________________________________________________________________________________________________________________________________________________________________________________________________________________________________________________________________________________________________  Verner Chol AND ERRANDS . Who can help with a thorough cleaning before baby is born? . Make a list of people who will help with housekeeping and chores, like laundry, light cleaning, dishes, bathrooms, etc. . Who can run some  errands for you? Marland Kitchen What can you do to make sure you are stocked with basic supplies before baby is born? . Who is going to do the  shopping? ______________________________________________________________________________________________________________________________________________________________________________________________________________________________________________________________________________________________________________________________________________________________________________________________________     Family Adjustment *Nurture yourselves.it helps parents be more loving and allows for better bonding with their child.* . What sorts of things do you and partner enjoy doing together? Which activities help you to connect and strengthen your relationship? Make a list of those things. Make a list of people whom you trust to care for your baby so you can have some time together as a couple. . What types of things help partner feel connected to Mom? Make a list. . What needs will partner have in order to bond with baby? . Other children? Who will care for them when you go into labor and while you are in the hospital? . Think about what the needs of your older children might be. Who can help you meet those needs? In what ways are you helping them prepare for bringing baby home? List some specific strategies you have for family adjustment. _______________________________________________________________________________________________________________________________________________________________________________________________________________________________________________________________________________________________________________________________________________  SUPPORT *Someone who can empathize with experiences normalizes your problems and makes them more bearable.* . Make a list of other friends, neighbors, and/or co-workers you know with infants (and small children, if applicable) with whom you can connect. . Make a list of local or online support groups, mom groups, etc. in which you can be  involved. ______________________________________________________________________________________________________________________________________________________________________________________________________________________________________________________________________________________________________________________________________________________________________________________________________  Childcare Plans . Investigate and plan for childcare if mom is returning to work. . Talk about mom's concerns about her transition back to work. . Talk about partner's concerns regarding this transition.  Mental Health *Your mental health is one of the highest priorities for a pregnant or postpartum mom.* . 1 in 5 women experience anxiety and/or depression from the time of conception through the first year after birth. . Postpartum Mood Disorders are the #1 complication of pregnancy and childbirth and the suffering experienced by these mothers is not necessary! These illnesses are temporary and respond well to treatment, which often includes self-care, social support, talk therapy, and medication when needed. . Women experiencing anxiety and depression often say things like: "I'm supposed to be happy.why do I feel so sad?", "Why can't I snap out of it?", "I'm having thoughts that scare me." . There is no need to be embarrassed if you are feeling these symptoms: o Overwhelmed, anxious, angry, sad, guilty, irritable, hopeless, exhausted but can't sleep o You are NOT alone. You are NOT to blame. With help, you WILL be well. . Where can I find help? Medical professionals such as your OB, midwife, gynecologist, family practitioner, primary care provider, pediatrician, or mental health providers; [facility] support groups: Feelings After Birth, Breastfeeding Support Group, Baby and Me Group, and Fit 4 Two exercise classes. . You have permission to ask for help. It will confirm your feelings, validate your  experiences, share/learn coping strategies, and gain support and encouragement as you heal. You are important! BRAINSTORM . Make a list of local resources, including resources for mom and for partner. . Identify support groups. . Identify people to call late at night - include names and contact info. . Talk with partner about perinatal mood and anxiety disorders. . Talk with your OB, midwife, and doula about baby blues and about perinatal mood and anxiety disorders. . Talk with your pediatrician about perinatal mood and anxiety disorders.   Support & Sanity Savers   What do you really  need?  . Basics . In preparing for a new baby, many expectant parents spend hours shopping for baby clothes, decorating the nursery, and deciding which car seat to buy. Yet most don't think much about what the reality of parenting a newborn will be like, and what they need to make it through that. So, here is the advice of experienced parents. We know you'll read this, and think "they're exaggerating, I don't really need that." Just trust [place] on these, OK? Plan for all of this, and if it turns out you don't need it, come back and teach [place] how you did it!  Satira Anis (Once baby's survival needs are met, make sure you attend to your own survival needs!) . Sleep . An average newborn sleeps 16-18 hours per day, over 6-7 sleep periods, rarely more than three hours at a time. It is normal and healthy for a newborn to wake throughout the night... but really hard on parents!! . Naps. Prioritize sleep above any responsibilities like: cleaning house, visiting friends, running errands, etc.  Sleep whenever baby sleeps. If you can't nap, at least have restful times when baby eats. The more rest you get, the more patient you will be, the more emotionally stable, and better at solving problems.  . Food . You may not have realized it would be difficult to eat when you have a newborn. Yet, when we talk to . countless new  parents, they say things like "it may be 2:00 pm when I realize I haven't had breakfast yet." Or "every time we sit down to dinner, baby needs to eat, and my food gets cold, so I don't bother to eat it." . Finger food. Before your baby is born, stock up with one months' worth of food that: 1) you can eat with one hand while holding a baby, 2) doesn't need to be prepped, 3) is good hot or cold, 4) doesn't spoil when left out for a few hours, and 5) you like to eat. Think about: nuts, dried fruit, Clif bars, pretzels, jerky, gogurt, baby carrots, apples, bananas, crackers, cheez-n-crackers, string cheese, hot pockets or frozen burritos to microwave, garden burgers and breakfast pastries to put in the toaster, yogurt drinks, etc. . Restaurant Menus. Make lists of your favorite restaurants & menu items. When family/friends want to help, you can give specific information without much thought. They can either bring you the food or send gift cards for just the right meals. Rosaura Carpenter Meals.  Take some time to make a few meals to put in the freezer ahead of time.  Easy to freeze meals can be anything such as soup, lasagna, chicken pie, or spaghetti sauce. . Set up a Meal Schedule.  Ask friends and family to sign up to bring you meals during the first few weeks of being home. (It can be passed around at baby showers!) You have no idea how helpful this will be until you are in the throes of parenting.  https://[email or web address].[email or web address] is a great website to check out. . Emotional Support . Know who to call when you're stressed out. Parenting a newborn is very challenging work. There are times when it totally overwhelms your normal coping abilities. EVERY NEW PARENT NEEDS TO HAVE A PLAN FOR WHO TO CALL WHEN THEY JUST CAN'T COPE ANY MORE. (And it has to be someone other than the baby's other parent!) Before your baby is born, come up with at least one person you can call for support - write  their phone number down and post it on the  refrigerator. Marland Kitchen Anxiety & Sadness. Baby blues are normal after pregnancy; however, there are more severe types of anxiety & sadness which can occur and should not be ignored.  They are always treatable, but you have to take the first step by reaching out for help. [facility] offers a "Mom Talk" group which meets every Tuesday from 10 am - 11 am.  This group is for new moms who need support and connection after their babies are born.  Call [phone].  Marland Kitchen Really, Really Helpful (Plan for them! Make sure these happen often!!) . Physical Support with Taking Care of Yourselves . Asking friends and family. Before your baby is born, set up a schedule of people who can come and visit and help out (or ask a friend to schedule for you). Any time someone says "let me know what I can do to help," sign them up for a day. When they get there, their job is not to take care of the baby (that's your job and your joy). Their job is to take care of you!  . Postpartum doulas. If you don't have anyone you can call on for support, look into postpartum doulas:  professionals at helping parents with caring for baby, caring for themselves, getting breastfeeding started, and helping with household tasks. www.padanc.org is a helpful website for learning about doulas in our area. . Peer Support / Parent Groups . Why: One of the greatest ideas for new parents is to be around other new parents. Parent groups give you a chance to share and listen to others who are going through the same season of life, get a sense of what is normal infant development by watching several babies learn and grow, share your stories of triumph and struggles with empathetic ears, and forgive your own mistakes when you realize all parents are learning by trial and error. . Where to find: There are many places you can meet other new parents throughout our community.  [facility] offers the following classes for new moms and their little ones:  Baby  and Me (Birth to [place]) and Breastfeeding Support Group. Go to www.conehealthybaby.com or call [phone] for more information. . Time for your Relationship . It's easy to get so caught up in meeting baby's immediate needs that it's hard to find time to connect with your partner, and meet the needs of your relationship. It's also easy to forget what "quality time with your partner" actually looks like. If you take your baby on a date, you'd be amazed how much of your couple time is spent feeding the baby, diapering the baby, admiring the baby, and talking about the baby. . Dating: Try to take time for just the two of you. Babysitter tip: Sometimes when moms are breastfeeding a newborn, they find it hard to figure out how to schedule outings around baby's unpredictable feeding schedules. Have the babysitter come for a three hour period. When she comes over, if baby has just eaten, you can leave right away, and come back in two hours. If baby hasn't fed recently, you start the date at home. Once baby gets hungry and gets a good feeding in, you can head out for the rest of your date time. . Date Nights at Home: If you can't get out, at least set aside one evening a week to prioritize your relationship: whenever baby dozes off or doesn't have any immediate needs, spend a little time  focusing on each other. . Potential conflicts: The main relationship conflicts that come up for new parents are: issues related to sexuality, financial stresses, a feeling of an unfair division of household tasks, and conflicts in parenting styles. The more you can work on these issues before baby arrives, the better!  Clint Guy and Frills (Don't forget these. and don't feel guilty for indulging in them!) . Everyone has something in life that is a fun little treat that they do just for themselves. It may be: reading the morning paper, or going for a daily jog, or having coffee with a friend once a week, or going to a movie on Friday  nights, or fine chocolates, or bubble baths, or curling up with a good book. . Unless you do fun things for yourself every now and then, it's hard to have the energy for fun with your baby. Whatever your "special" treats are, make sure you find a way to continue to indulge in them after your baby is born. These special moments can recharge you, and allow you to return to baby with a new joy   PERINATAL MOOD DISORDERS: [place]   Emergency and Crisis Resources:  If you are an imminent risk to self or others, are experiencing intense personal distress, and/or have noticed significant changes in activities of daily living, call:  . 911 . [facility] [place]: 985-277-1949 . Mobile Crisis: 424-349-1443 . National Suicide Hotline: (513)265-2524 Or visit the following crisis centers: . Local Emergency Departments . [facility]: [address], [place]. Hours: 8:30AM-5PM. Insurance Accepted: Medicaid, Medicare, and Uninsured.  Marland Kitchen [facility]  [address], [place] Mon-Friday 8am-3pm  [phone]                                                                                    Non-Crisis Resources: To identify specific providers that are covered by your insurance, contact your insurance company or local agencies: [place]: [phone] [facility]: [phone] [place]: [phone] Postpartum Support International- [place] 1-(719)593-4635                                                      Outpatient therapy and medication management providers:  [facility] [phone] Hours: 9AM-5PM  Insurance Accepted: Alben Spittle, Lorella Nimrod, Freddrick March, [place], Medicare  [facility] ([employer]) [phone] Hours: 8AM-5PM  nsurance Accepted: All insurances EXCEPT AARP, [place], [place], and  [place]: [phone]             Hours: 8AM-8PM Insurance Accepted: Cristal Ford, Freddrick March, [place], Medicare, [phone] [facility]: [phone] Hours: 8:30AM-7PM Insurance Accepted: Cristal Ford, Medicaid, Medicare, Tricare, [employer] Counseling:  [place] Accepted:  Holland Falling, [place], [employer], [employer], [place]  [phone] Hours: 9AM-5:30PM Insurance Accepted: Alben Spittle, Charlotte Crumb, and Medicaid, Medicare, [facility] Counsel[facility]:  [phone] Hours: 9am-5pm Insurance Accepted: BCBS; they do not accept Medicaid/Medicare [place]: [phone] Hours: 9am-9pm Insurance Accepted: All major insurance including Medicaid and Medicare [facility]: [phone] Hours: 9AM-5:30PM Insurance Accepted: All insurances EXCEPT Medicaid and Medicare. [facility]: [place]: [phone] [place]:  [place] (support for children in the NICU and/or with special needs), [place] [employer]: [phone]                                                                                     Online Resources: Postpartum Support International: http://jones-berg.com/  800-944-4PPD 2Moms Supporting Moms:  www.momssupportingmoms.net

## 2020-06-25 ENCOUNTER — Encounter: Payer: Self-pay | Admitting: *Deleted

## 2020-06-25 ENCOUNTER — Other Ambulatory Visit: Payer: Self-pay | Admitting: *Deleted

## 2020-06-25 ENCOUNTER — Ambulatory Visit (INDEPENDENT_AMBULATORY_CARE_PROVIDER_SITE_OTHER): Payer: Medicaid Other | Admitting: Obstetrics and Gynecology

## 2020-06-25 ENCOUNTER — Ambulatory Visit: Payer: Medicaid Other | Attending: Obstetrics and Gynecology

## 2020-06-25 ENCOUNTER — Encounter: Payer: Self-pay | Admitting: Obstetrics and Gynecology

## 2020-06-25 ENCOUNTER — Other Ambulatory Visit: Payer: Self-pay

## 2020-06-25 ENCOUNTER — Ambulatory Visit: Payer: Medicaid Other | Admitting: *Deleted

## 2020-06-25 VITALS — BP 107/49 | HR 79 | Wt 181.9 lb

## 2020-06-25 DIAGNOSIS — U071 Other viral diseases complicating pregnancy, first trimester: Secondary | ICD-10-CM

## 2020-06-25 DIAGNOSIS — O43199 Other malformation of placenta, unspecified trimester: Secondary | ICD-10-CM | POA: Insufficient documentation

## 2020-06-25 DIAGNOSIS — O98412 Viral hepatitis complicating pregnancy, second trimester: Secondary | ICD-10-CM | POA: Diagnosis not present

## 2020-06-25 DIAGNOSIS — O0990 Supervision of high risk pregnancy, unspecified, unspecified trimester: Secondary | ICD-10-CM

## 2020-06-25 DIAGNOSIS — B182 Chronic viral hepatitis C: Secondary | ICD-10-CM | POA: Diagnosis not present

## 2020-06-25 DIAGNOSIS — F1991 Other psychoactive substance use, unspecified, in remission: Secondary | ICD-10-CM

## 2020-06-25 DIAGNOSIS — Z8619 Personal history of other infectious and parasitic diseases: Secondary | ICD-10-CM

## 2020-06-25 DIAGNOSIS — O43192 Other malformation of placenta, second trimester: Secondary | ICD-10-CM | POA: Diagnosis not present

## 2020-06-25 DIAGNOSIS — O99322 Drug use complicating pregnancy, second trimester: Secondary | ICD-10-CM | POA: Diagnosis not present

## 2020-06-25 DIAGNOSIS — Z87898 Personal history of other specified conditions: Secondary | ICD-10-CM

## 2020-06-25 DIAGNOSIS — O98511 Other viral diseases complicating pregnancy, first trimester: Secondary | ICD-10-CM | POA: Insufficient documentation

## 2020-06-25 DIAGNOSIS — Z3A24 24 weeks gestation of pregnancy: Secondary | ICD-10-CM

## 2020-06-25 NOTE — Progress Notes (Signed)
Subjective:  Abigail Pittman is a 26 y.o. G2P0010 at [redacted]w[redacted]d being seen today for ongoing prenatal care.  She is currently monitored for the following issues for this high-risk pregnancy and has Supervision of high risk pregnancy, antepartum; History of drug use; History of hepatitis C; and Marginal insertion of umbilical cord affecting management of mother on their problem list.  Patient reports no complaints.  Contractions: Not present. Vag. Bleeding: None.  Movement: Present. Denies leaking of fluid.   The following portions of the patient's history were reviewed and updated as appropriate: allergies, current medications, past family history, past medical history, past social history, past surgical history and problem list. Problem list updated.  Objective:   Vitals:   06/25/20 0931  BP: (!) 107/49  Pulse: 79  Weight: 181 lb 14.4 oz (82.5 kg)    Fetal Status: Fetal Heart Rate (bpm): 145   Movement: Present     General:  Alert, oriented and cooperative. Patient is in no acute distress.  Skin: Skin is warm and dry. No rash noted.   Cardiovascular: Normal heart rate noted  Respiratory: Normal respiratory effort, no problems with respiration noted  Abdomen: Soft, gravid, appropriate for gestational age. Pain/Pressure: Absent     Pelvic:  Cervical exam deferred        Extremities: Normal range of motion.  Edema: None  Mental Status: Normal mood and affect. Normal behavior. Normal judgment and thought content.   Urinalysis:      Assessment and Plan:  Pregnancy: G2P0010 at [redacted]w[redacted]d  1. Supervision of high risk pregnancy, antepartum Stable  Glucola next visit  2. Marginal insertion of umbilical cord affecting management of mother Growth scan today, 13 % F/U scan scheduled  3. History of hepatitis C Stable Has been referred for further eval and treatment, PP. CMP last visit  Preterm labor symptoms and general obstetric precautions including but not limited to vaginal bleeding,  contractions, leaking of fluid and fetal movement were reviewed in detail with the patient. Please refer to After Visit Summary for other counseling recommendations.  Return in about 4 weeks (around 07/23/2020) for OB visit, face to face, MD only, fasting appt for Glucola.   Hermina Staggers, MD

## 2020-06-25 NOTE — Patient Instructions (Signed)

## 2020-07-15 ENCOUNTER — Emergency Department (HOSPITAL_COMMUNITY)
Admission: EM | Admit: 2020-07-15 | Discharge: 2020-07-15 | Disposition: A | Discharge status: Home / Self Care | Payer: Medicaid Other | Attending: Emergency Medicine | Admitting: Emergency Medicine

## 2020-07-15 ENCOUNTER — Encounter (HOSPITAL_COMMUNITY): Payer: Self-pay

## 2020-07-15 ENCOUNTER — Other Ambulatory Visit: Payer: Self-pay

## 2020-07-15 DIAGNOSIS — Z87891 Personal history of nicotine dependence: Secondary | ICD-10-CM | POA: Insufficient documentation

## 2020-07-15 DIAGNOSIS — Z8616 Personal history of COVID-19: Secondary | ICD-10-CM | POA: Insufficient documentation

## 2020-07-15 DIAGNOSIS — K047 Periapical abscess without sinus: Secondary | ICD-10-CM | POA: Diagnosis not present

## 2020-07-15 DIAGNOSIS — Z3A27 27 weeks gestation of pregnancy: Secondary | ICD-10-CM | POA: Diagnosis not present

## 2020-07-15 DIAGNOSIS — R590 Localized enlarged lymph nodes: Secondary | ICD-10-CM | POA: Insufficient documentation

## 2020-07-15 DIAGNOSIS — B369 Superficial mycosis, unspecified: Secondary | ICD-10-CM | POA: Diagnosis not present

## 2020-07-15 DIAGNOSIS — O99613 Diseases of the digestive system complicating pregnancy, third trimester: Secondary | ICD-10-CM | POA: Diagnosis present

## 2020-07-15 DIAGNOSIS — K0889 Other specified disorders of teeth and supporting structures: Secondary | ICD-10-CM

## 2020-07-15 MED ORDER — CEPHALEXIN 500 MG PO CAPS: 500.0000 mg | ORAL_CAPSULE | Freq: Two times a day (BID) | ORAL | 0 refills | Status: AC

## 2020-07-15 MED ORDER — CLOTRIMAZOLE 1 % EX CREA: TOPICAL_CREAM | CUTANEOUS | 0 refills | Status: DC

## 2020-07-15 NOTE — ED Provider Notes (Signed)
Emergency Medicine Provider Triage Evaluation Note  Abigail Pittman , a 25 y.o. female  was evaluated in triage.  Pt complains of facial pain   Review of Systems  Positive: toothache Negative: cough  Physical Exam  BP 113/67   Pulse (!) 102   Temp 98.7 F (37.1 C)   Resp 16   LMP 01/09/2020 (Exact Date)   SpO2 97%  Gen:   Awake, no distress   Resp:  Normal effort  MSK:   Moves extremities without difficulty  Other:    Medical Decision Making  Medically screening exam initiated at 12:53 PM.  Appropriate orders placed.  Abigail Pittman was informed that the remainder of the evaluation will be completed by another provider, this initial triage assessment does not replace that evaluation, and the importance of remaining in the ED until their evaluation is complete.     Elson Areas, [place] 07/15/20 1254    Tegeler, Canary Brim, MD 07/15/20 2036

## 2020-07-15 NOTE — ED Triage Notes (Signed)
Pt presents with swelling Right lower jaw area. Pt reports having tooth aches 2 weeks ago.

## 2020-07-15 NOTE — Discharge Instructions (Signed)
Your history and exam today are consistent with a dental infection causing the pain.  Please take the antibiotics to prevent worsening and follow-up with a dentist for further management.  Please also use the fungal cream to help with what is likely ringworm on your body and foot.  Please rest and stay hydrated and follow-up with your primary doctor.  If any symptoms change or worsen, please return to the nearest emergency department.

## 2020-07-15 NOTE — ED Provider Notes (Signed)
[facility] EMERGENCY DEPARTMENT Provider Note   CSN: [id number] Arrival date & time: 07/15/20  1156     History Chief Complaint  Patient presents with  . Facial Swelling    Abigail Pittman is a 25 y.o. female.  The history is provided by the patient and medical records. No language interpreter was used.  Dental Pain Location:  Lower Lower teeth location: r lower poterior. Quality:  Aching and dull Severity:  Moderate Onset quality:  Gradual Duration:  1 day Timing:  Constant Progression:  Waxing and waning Chronicity:  Recurrent Context: not dental fracture, not enamel fracture, not recent dental surgery and not trauma   Relieved by:  Nothing Worsened by:  Nothing Ineffective treatments:  None tried Associated symptoms: no congestion, no difficulty swallowing, no drooling, no facial pain, no facial swelling, no fever (chills present), no headaches, no neck pain, no neck swelling, no oral bleeding, no oral lesions and no trismus        Past Medical History:  Diagnosis Date  . COVID-19 03/2020  . Hepatitis C   . History of drug use   . Threatened miscarriage 02/17/2020  . UTI (urinary tract infection)     Patient Active Problem List   Diagnosis Date Noted  . Marginal insertion of umbilical cord affecting management of mother 05/23/2020  . Supervision of high risk pregnancy, antepartum 03/27/2020  . History of drug use 03/27/2020  . History of hepatitis C 03/27/2020    Past Surgical History:  Procedure Laterality Date  . SHOULDER SURGERY Left      OB History    Gravida  3   Para  0   Term  0   Preterm  0   AB  1   Living  0     SAB  1   IAB  0   Ectopic  0   Multiple  0   Live Births  0           Family History  Problem Relation Age of Onset  . Diverticulitis Mother   . Pancreatic cancer Father   . Skin cancer Father   . Colon cancer Father     Social History   Tobacco Use  . Smoking status: Former  Smoker    Packs/day: 1.00    Types: Cigarettes    Quit date: 02/09/2020    Years since quitting: 0.4  . Smokeless tobacco: Never Used  Vaping Use  . Vaping Use: Former  . Quit date: 08/26/2019  . Substances: Nicotine-salt  Substance Use Topics  . Alcohol use: Not Currently  . Drug use: Not Currently    Types: Marijuana, Methamphetamines, Heroin    Comment: 03/27/20 6 months free    Home Medications Prior to Admission medications   Medication Sig Start Date End Date Taking? Authorizing Provider  acetaminophen (TYLENOL) 325 MG tablet Take 650 mg by mouth every 6 (six) hours as needed.    [provider]  Prenatal Vit-Fe Fumarate-FA (PRENATAL VITAMINS PO) Take 1 tablet by mouth daily.    [provider]    Allergies    Penicillins  Review of Systems   Review of Systems  Constitutional: Positive for chills. Negative for diaphoresis and fever (chills present).  HENT: Negative for congestion, drooling, facial swelling and mouth sores.   Eyes: Negative for visual disturbance.  Respiratory: Negative for cough, chest tightness, shortness of breath and wheezing.   Cardiovascular: Negative for chest pain.  Gastrointestinal: Negative for  abdominal pain.  Genitourinary: Negative for dysuria, flank pain, vaginal bleeding, vaginal discharge and vaginal pain.  Musculoskeletal: Negative for back pain, neck pain and neck stiffness.  Skin: Positive for rash. Negative for wound.  Neurological: Negative for light-headedness and headaches.  Psychiatric/Behavioral: Negative for agitation and confusion.  All other systems reviewed and are negative.   Physical Exam Updated Vital Signs BP 113/67   Pulse (!) 102   Temp 98.7 F (37.1 C)   Resp 16   LMP 01/09/2020 (Exact Date)   SpO2 97%   Physical Exam Vitals and nursing note reviewed.  Constitutional:      General: She is not in acute distress.    Appearance: She is well-developed. She is not ill-appearing,  toxic-appearing or diaphoretic.  HENT:     Head: Normocephalic and atraumatic.     Nose: No congestion or rhinorrhea.     Mouth/Throat:     Lips: No lesions.     Mouth: Mucous membranes are moist. No injury, lacerations or angioedema.     Dentition: Abnormal dentition.     Tongue: No lesions. Tongue does not deviate from midline.     Pharynx: Oropharynx is clear. No pharyngeal swelling, oropharyngeal exudate, posterior oropharyngeal erythema or uvula swelling.     Tonsils: No tonsillar exudate or tonsillar abscesses.   Eyes:     Extraocular Movements: Extraocular movements intact.     Conjunctiva/sclera: Conjunctivae normal.  Neck:     Vascular: No carotid bruit.  Cardiovascular:     Rate and Rhythm: Normal rate and regular rhythm.     Pulses: Normal pulses.     Heart sounds: No murmur heard.   Pulmonary:     Effort: Pulmonary effort is normal. No respiratory distress.     Breath sounds: Normal breath sounds.  Abdominal:     General: There is distension (gravid).     Palpations: Abdomen is soft.     Tenderness: There is no abdominal tenderness. There is no right CVA tenderness, left CVA tenderness, guarding or rebound.    Musculoskeletal:        General: No tenderness.     Cervical back: Neck supple. No tenderness.     Right lower leg: No edema.     Left lower leg: No edema.       Feet:  Lymphadenopathy:     Cervical: Cervical adenopathy present.  Skin:    General: Skin is warm and dry.     Capillary Refill: Capillary refill takes less than 2 seconds.     Findings: Rash present.  Neurological:     General: No focal deficit present.     Mental Status: She is alert.     Sensory: No sensory deficit.     Motor: No weakness.  Psychiatric:        Mood and Affect: Mood normal.     ED Results / Procedures / Treatments   Labs (all labs ordered are listed, but only abnormal results are displayed) Labs Reviewed - No data to display  EKG None  Radiology No results  found.  Procedures Procedures   Medications Ordered in ED Medications - No data to display  ED Course  I have reviewed the triage vital signs and the nursing notes.  Pertinent labs & imaging results that were available during my care of the patient were reviewed by me and considered in my medical decision making (see chart for details).    MDM Rules/Calculators/A&P  Abigail Pittman is a 25 y.o. female with a past medical history significant for hepatitis C and is currently [redacted] weeks pregnant who presents with right lower dental pain and abdominal rash.  Patient thinks that she has ringworm or fungal infection on her abdomen that is similar to what she is currently doing with on her left foot.  She reports it has been there for a little while.  She reports it is itching but is not painful.  No other abdominal complaints.  No vaginal discharge vaginal bleeding or urinary complaints.  She reports that about a week ago she had some discomfort in her right lower jaw and was concerned about tooth ache.  She says it improved but then today it worsened and she had some chills.  She denies any other complaints including no significant headaches, neck pain, neck stiffness, regularly swallowing or breathing, chest pain, shortness of breath, or cough.  No other complaints reported.  On exam, patient has no trismus.  Patient has some erythema in her right lower gumline but no bulging area or fluctuance amenable to drainage for abscess.  No evidence of Ludewig's angina.  No PTA or RPA seen.  No other posterior oropharyngeal erythema.  No stridor.  Some mild tenderness in the jawline externally and mild lymph node palpated in her right neck.  No fullness palpated otherwise.  Lungs clear and chest nontender.  Abdomen has a small circular area of what appears to be ringworm in her left upper abdomen.  Nontender.  Some scaling present.  Patient also has some similar rash on her left  foot and the inferior side as well as in her toes.  Good pulses, strength, and sensation.  Exam otherwise unremarkable.  Had a discussion with the patient and given her exam, low suspicion for Ludewig's angina, PTA, RPA, or deep infection.  I do suspect she is having some dental abscess or infection related to what appears to be interrupting wisdom tooth.  Patient will be started on antibiotics for dental infection and will follow-up with dentistry.  I spoke with pharmacy who recommended Keflex given the patient's penicillin intolerance.  Patient has tolerated Keflex before.  He also recommended topical treatment as well for the likely fungal infection of the skin.  Patient will be given these prescriptions and follow-up as we directed.  She understands return precautions and follow-up instructions.  Patient will be discharged.  3:24 PM Spoke with pharmacy who recommends Keflex for the dental infection and topical clotrimazole for the likely fungal infection of the abdomen and leg.  Patient will follow-up with dentistry and agrees with plan of care.  We again agreed on holding on imaging or other labs at this time due to the patient's otherwise well appearance.  Patient present plan of care and will return as directed.  She no questions or concerns and was discharged in good condition.   Final Clinical Impression(s) / ED Diagnoses Final diagnoses:  Dental infection  Pain, dental  Fungal rash of torso    Rx / DC Orders ED Discharge Orders         Ordered    cephALEXin (KEFLEX) 500 MG capsule  2 times daily        07/15/20 1525    clotrimazole (LOTRIMIN) 1 % cream        07/15/20 1525          Clinical Impression: 1. Dental infection   2. Pain, dental   3. Fungal rash of torso  Disposition: Discharge  Condition: Good  I have discussed the results, Dx and Tx plan with the pt(& family if present). He/she/they expressed understanding and agree(s) with the plan. Discharge  instructions discussed at great length. Strict return precautions discussed and pt &/or family have verbalized understanding of the instructions. No further questions at time of discharge.    New Prescriptions   CEPHALEXIN (KEFLEX) 500 MG CAPSULE    Take 1 capsule (500 mg total) by mouth 2 (two) times daily for 7 days.   CLOTRIMAZOLE (LOTRIMIN) 1 % CREAM    Apply to affected area 2 times daily    Follow Up: a dentist and your PCP     [facility] [address] [place] [id number] [phone] Schedule an appointment as soon as possible for a visit    [facility] EM[facility] [address] [id number] [place] [id number] [phone]       Gertrue Willette, Canary Brimhristopher J, MD 07/15/20 1527

## 2020-07-16 ENCOUNTER — Telehealth: Payer: Self-pay | Admitting: Lactation Services

## 2020-07-16 NOTE — Telephone Encounter (Signed)
Patients mother called in and LM reporting patient was put on Keflex yesterday for tooth pain and she would like the ATB changed as it is not working.   Returned patients call and informed her that she needs to continue antibiotics until they are completed, as they do not work in one day and it may take a few days to see them working. Reviewed Keflex was given based on Symptoms and her allergies.   Patient reports she needs to call and schedule an appointment with a Dentist, advised her to follow through and go ahead and make a Dental appointment.   Patient asked who she should follow up with if she does not get better, advised Dentist of Urgent Care. Patient concerned infection will spread to infant, reviewed that is rarely the case.   Patient then hung phone up.

## 2020-07-23 ENCOUNTER — Other Ambulatory Visit: Payer: Self-pay | Admitting: General Practice

## 2020-07-23 DIAGNOSIS — O0990 Supervision of high risk pregnancy, unspecified, unspecified trimester: Secondary | ICD-10-CM

## 2020-07-25 ENCOUNTER — Other Ambulatory Visit: Payer: Medicaid Other

## 2020-07-25 ENCOUNTER — Encounter: Payer: Self-pay | Admitting: *Deleted

## 2020-07-25 ENCOUNTER — Ambulatory Visit: Payer: Medicaid Other | Attending: Obstetrics and Gynecology

## 2020-07-25 ENCOUNTER — Other Ambulatory Visit: Payer: Self-pay | Admitting: Maternal & Fetal Medicine

## 2020-07-25 ENCOUNTER — Other Ambulatory Visit: Payer: Self-pay

## 2020-07-25 ENCOUNTER — Ambulatory Visit (INDEPENDENT_AMBULATORY_CARE_PROVIDER_SITE_OTHER): Payer: Medicaid Other | Admitting: Obstetrics and Gynecology

## 2020-07-25 ENCOUNTER — Ambulatory Visit: Payer: Medicaid Other | Admitting: *Deleted

## 2020-07-25 VITALS — BP 108/57 | HR 79 | Wt 184.8 lb

## 2020-07-25 DIAGNOSIS — Z3A28 28 weeks gestation of pregnancy: Secondary | ICD-10-CM

## 2020-07-25 DIAGNOSIS — Z8719 Personal history of other diseases of the digestive system: Secondary | ICD-10-CM | POA: Insufficient documentation

## 2020-07-25 DIAGNOSIS — Z8619 Personal history of other infectious and parasitic diseases: Secondary | ICD-10-CM | POA: Diagnosis present

## 2020-07-25 DIAGNOSIS — F1991 Other psychoactive substance use, unspecified, in remission: Secondary | ICD-10-CM

## 2020-07-25 DIAGNOSIS — O99323 Drug use complicating pregnancy, third trimester: Secondary | ICD-10-CM | POA: Diagnosis not present

## 2020-07-25 DIAGNOSIS — O43199 Other malformation of placenta, unspecified trimester: Secondary | ICD-10-CM

## 2020-07-25 DIAGNOSIS — B182 Chronic viral hepatitis C: Secondary | ICD-10-CM | POA: Diagnosis not present

## 2020-07-25 DIAGNOSIS — F1990 Other psychoactive substance use, unspecified, uncomplicated: Secondary | ICD-10-CM

## 2020-07-25 DIAGNOSIS — O43193 Other malformation of placenta, third trimester: Secondary | ICD-10-CM | POA: Diagnosis not present

## 2020-07-25 DIAGNOSIS — Z23 Encounter for immunization: Secondary | ICD-10-CM

## 2020-07-25 DIAGNOSIS — O0990 Supervision of high risk pregnancy, unspecified, unspecified trimester: Secondary | ICD-10-CM

## 2020-07-25 DIAGNOSIS — O322XX0 Maternal care for transverse and oblique lie, not applicable or unspecified: Secondary | ICD-10-CM

## 2020-07-25 DIAGNOSIS — Z87898 Personal history of other specified conditions: Secondary | ICD-10-CM | POA: Insufficient documentation

## 2020-07-25 DIAGNOSIS — O98413 Viral hepatitis complicating pregnancy, third trimester: Secondary | ICD-10-CM

## 2020-07-25 NOTE — Progress Notes (Signed)
   PRENATAL VISIT NOTE  Subjective:  Abigail Pittman is a 25 y.o. G2P0010 at [redacted]w[redacted]d being seen today for ongoing prenatal care.  She is currently monitored for the following issues for this high-risk pregnancy and has Supervision of high risk pregnancy, antepartum; History of drug use; History of hepatitis C; and Marginal insertion of umbilical cord affecting management of mother on their problem list.  Patient reports no complaints.  Contractions: Not present. Vag. Bleeding: None.  Movement: Present. Denies leaking of fluid.   The following portions of the patient's history were reviewed and updated as appropriate: allergies, current medications, past family history, past medical history, past social history, past surgical history and problem list.   Objective:   Vitals:   07/25/20 0828  BP: (!) 108/57  Pulse: 79  Weight: 184 lb 12.8 oz (83.8 kg)    Fetal Status: Fetal Heart Rate (bpm): 143   Movement: Present     General:  Alert, oriented and cooperative. Patient is in no acute distress.  Skin: Skin is warm and dry. No rash noted.   Cardiovascular: Normal heart rate noted  Respiratory: Normal respiratory effort, no problems with respiration noted  Abdomen: Soft, gravid, appropriate for gestational age.  Pain/Pressure: Absent     Pelvic: Cervical exam deferred        Extremities: Normal range of motion.  Edema: None  Mental Status: Normal mood and affect. Normal behavior. Normal judgment and thought content.   Assessment and Plan:  Pregnancy: G2P0010 at [redacted]w[redacted]d 1. History of hepatitis C Baseline labs today, cmp/coags/fibrinogen - Comprehensive metabolic panel  2. Supervision of high risk pregnancy, antepartum Routine care. Pt not fasting. Will do 1h GTT today; I d/w the lab re: this.  - Tdap vaccine greater than or equal to 7yo IM  3. [redacted] weeks gestation of pregnancy  4. Marginal insertion of umbilical cord affecting management of mother Has surveillance growth u/s  today 4/25: 13%, 572gm, ac 20% Continue with qmonth testing, and I d/w her that we recommend delivery at 39wks  5. Dental Patient feeling well. She went to a dentist and had the affected tooth removed.   Preterm labor symptoms and general obstetric precautions including but not limited to vaginal bleeding, contractions, leaking of fluid and fetal movement were reviewed in detail with the patient. Please refer to After Visit Summary for other counseling recommendations.   No follow-ups on file.  Future Appointments  Date Time Provider Department Center  07/25/2020  8:50 AM [facility] LAB [facility]  07/25/2020  9:45 AM [facility] NURSE [facility]  07/25/2020 10:00 AM [facility] US1 [facility]    Harper Bing, MD

## 2020-07-26 LAB — COMPREHENSIVE METABOLIC PANEL
ALT: 21 IU/L (ref 0–32)
AST: 22 IU/L (ref 0–40)
Albumin/Globulin Ratio: 1.3 (ref 1.2–2.2)
Albumin: 3.5 g/dL — ABNORMAL LOW (ref 3.9–5.0)
Alkaline Phosphatase: 93 IU/L (ref 44–121)
BUN/Creatinine Ratio: 10 (ref 9–23)
BUN: 6 mg/dL (ref 6–20)
Bilirubin Total: 0.5 mg/dL (ref 0.0–1.2)
CO2: 18 mmol/L — ABNORMAL LOW (ref 20–29)
Calcium: 8.7 mg/dL (ref 8.7–10.2)
Chloride: 104 mmol/L (ref 96–106)
Creatinine, Ser: 0.61 mg/dL (ref 0.57–1.00)
Globulin, Total: 2.7 g/dL (ref 1.5–4.5)
Glucose: 55 mg/dL — ABNORMAL LOW (ref 65–99)
Potassium: 3.8 mmol/L (ref 3.5–5.2)
Sodium: 137 mmol/L (ref 134–144)
Total Protein: 6.2 g/dL (ref 6.0–8.5)
eGFR: 129 mL/min/{1.73_m2} (ref >59)

## 2020-07-26 LAB — CBC
Hematocrit: 36.7 % (ref 34.0–46.6)
Hemoglobin: 12.3 g/dL (ref 11.1–15.9)
MCH: 31.9 pg (ref 26.6–33.0)
MCHC: 33.5 g/dL (ref 31.5–35.7)
MCV: 95 fL (ref 79–97)
Platelets: 264 10*3/uL (ref 150–450)
RBC: 3.86 x10E6/uL (ref 3.77–5.28)
RDW: 12.1 % (ref 11.7–15.4)
WBC: 6.2 10*3/uL (ref 3.4–10.8)

## 2020-07-26 LAB — FIBRINOGEN: Fibrinogen: 296 mg/dL (ref 193–507)

## 2020-07-26 LAB — GLUCOSE TOLERANCE, 1 HOUR: Glucose, 1Hr PP: 69 mg/dL (ref 65–199)

## 2020-07-26 LAB — APTT: aPTT: 30 s (ref 24–33)

## 2020-07-26 LAB — HIV ANTIBODY (ROUTINE TESTING W REFLEX): HIV Screen 4th Generation wRfx: NONREACTIVE

## 2020-07-26 LAB — RPR: RPR Ser Ql: NONREACTIVE

## 2020-08-08 ENCOUNTER — Encounter: Payer: Medicaid Other | Admitting: Obstetrics and Gynecology

## 2020-08-22 ENCOUNTER — Other Ambulatory Visit: Payer: Self-pay | Admitting: *Deleted

## 2020-08-22 ENCOUNTER — Encounter: Payer: Self-pay | Admitting: *Deleted

## 2020-08-22 ENCOUNTER — Other Ambulatory Visit: Payer: Self-pay

## 2020-08-22 ENCOUNTER — Ambulatory Visit: Payer: Medicaid Other | Attending: Maternal & Fetal Medicine

## 2020-08-22 ENCOUNTER — Ambulatory Visit: Payer: Medicaid Other | Admitting: *Deleted

## 2020-08-22 ENCOUNTER — Ambulatory Visit (INDEPENDENT_AMBULATORY_CARE_PROVIDER_SITE_OTHER): Payer: Medicaid Other | Admitting: Obstetrics and Gynecology

## 2020-08-22 VITALS — BP 106/64 | HR 87

## 2020-08-22 VITALS — BP 109/69 | HR 87 | Wt 191.7 lb

## 2020-08-22 DIAGNOSIS — O98413 Viral hepatitis complicating pregnancy, third trimester: Secondary | ICD-10-CM

## 2020-08-22 DIAGNOSIS — B182 Chronic viral hepatitis C: Secondary | ICD-10-CM

## 2020-08-22 DIAGNOSIS — Z8619 Personal history of other infectious and parasitic diseases: Secondary | ICD-10-CM

## 2020-08-22 DIAGNOSIS — O0990 Supervision of high risk pregnancy, unspecified, unspecified trimester: Secondary | ICD-10-CM | POA: Insufficient documentation

## 2020-08-22 DIAGNOSIS — O43193 Other malformation of placenta, third trimester: Secondary | ICD-10-CM

## 2020-08-22 DIAGNOSIS — O99323 Drug use complicating pregnancy, third trimester: Secondary | ICD-10-CM

## 2020-08-22 DIAGNOSIS — O43199 Other malformation of placenta, unspecified trimester: Secondary | ICD-10-CM

## 2020-08-22 DIAGNOSIS — Z3A32 32 weeks gestation of pregnancy: Secondary | ICD-10-CM

## 2020-08-22 DIAGNOSIS — L299 Pruritus, unspecified: Secondary | ICD-10-CM

## 2020-08-22 DIAGNOSIS — Z87898 Personal history of other specified conditions: Secondary | ICD-10-CM | POA: Insufficient documentation

## 2020-08-22 DIAGNOSIS — F1991 Other psychoactive substance use, unspecified, in remission: Secondary | ICD-10-CM

## 2020-08-22 MED ORDER — DOCUSATE SODIUM 100 MG PO CAPS: 100.0000 mg | ORAL_CAPSULE | Freq: Two times a day (BID) | ORAL | 0 refills | Status: DC

## 2020-08-22 NOTE — Progress Notes (Signed)
   PRENATAL VISIT NOTE  Subjective:  Abigail Pittman is a 25 y.o. G2P0010 at [redacted]w[redacted]d being seen today for ongoing prenatal care.  She is currently monitored for the following issues for this high-risk pregnancy and has Supervision of high risk pregnancy, antepartum; History of drug use; History of hepatitis C; Marginal insertion of umbilical cord affecting management of mother; History of dental abscess; and Pruritus on their problem list.  Patient reports  two weeks of itching, no rash. Some nausea .  Contractions: Not present. Vag. Bleeding: None.  Movement: Present. Denies leaking of fluid.   The following portions of the patient's history were reviewed and updated as appropriate: allergies, current medications, past family history, past medical history, past social history, past surgical history and problem list.   Objective:   Vitals:   08/22/20 1038  BP: 109/69  Pulse: 87  Weight: 191 lb 11.2 oz (87 kg)    Fetal Status: Fetal Heart Rate (bpm): 147   Movement: Present     General:  Alert, oriented and cooperative. Patient is in no acute distress.  Skin: Skin is warm and dry. No rash noted.   Cardiovascular: Normal heart rate noted  Respiratory: Normal respiratory effort, no problems with respiration noted  Abdomen: Soft, gravid, appropriate for gestational age.  Pain/Pressure: Present     Pelvic: Cervical exam deferred        Extremities: Normal range of motion.  Edema: Trace  Mental Status: Normal mood and affect. Normal behavior. Normal judgment and thought content.   Assessment and Plan:  Pregnancy: G2P0010 at [redacted]w[redacted]d 1. Pruritus No rash visible. Will check labs for ICP. OTCs recommended - Comprehensive metabolic panel - Bile acids, total  2. Marginal insertion of umbilical cord affecting management of mother Has rpt growth in a month. U/s today showed 15%, 1751gm, afi 15.6, ac 21%. 5/25 growth with 18% for efw and 34% for ac  3. Supervision of high risk pregnancy,  antepartum  4. History of hepatitis C Neg baseline at 28wks. +quant on 1/31. Needs GI f/u PP  Preterm labor symptoms and general obstetric precautions including but not limited to vaginal bleeding, contractions, leaking of fluid and fetal movement were reviewed in detail with the patient. Please refer to After Visit Summary for other counseling recommendations.   Return in about 2 weeks (around 09/05/2020) for low risk ob, md or app, in person.  Future Appointments  Date Time Provider Department Center  09/26/2020  8:30 AM [facility] NURSE [facility]  09/26/2020  8:45 AM [facility] US6 [facility]    Cohutta Bing, MD

## 2020-08-22 NOTE — Progress Notes (Signed)
Pt states has been experiencing a lot of nausea, uneasy feeling, hard for her to explain she said. Also has been itching all over for the last 2 weeks, more intense now she stated, especially on her belly, hands and arms.

## 2020-08-23 LAB — COMPREHENSIVE METABOLIC PANEL
ALT: 19 IU/L (ref 0–32)
AST: 20 IU/L (ref 0–40)
Albumin/Globulin Ratio: 1.3 (ref 1.2–2.2)
Albumin: 3.4 g/dL — ABNORMAL LOW (ref 3.9–5.0)
Alkaline Phosphatase: 108 IU/L (ref 44–121)
BUN/Creatinine Ratio: 17 (ref 9–23)
BUN: 10 mg/dL (ref 6–20)
Bilirubin Total: 0.4 mg/dL (ref 0.0–1.2)
CO2: 17 mmol/L — ABNORMAL LOW (ref 20–29)
Calcium: 8.6 mg/dL — ABNORMAL LOW (ref 8.7–10.2)
Chloride: 106 mmol/L (ref 96–106)
Creatinine, Ser: 0.59 mg/dL (ref 0.57–1.00)
Globulin, Total: 2.7 g/dL (ref 1.5–4.5)
Glucose: 70 mg/dL (ref 65–99)
Potassium: 4.4 mmol/L (ref 3.5–5.2)
Sodium: 138 mmol/L (ref 134–144)
Total Protein: 6.1 g/dL (ref 6.0–8.5)
eGFR: 130 mL/min/{1.73_m2} (ref >59)

## 2020-08-23 LAB — BILE ACIDS, TOTAL: Bile Acids Total: 7.8 umol/L (ref 0.0–10.0)

## 2020-09-05 ENCOUNTER — Other Ambulatory Visit: Payer: Self-pay

## 2020-09-05 ENCOUNTER — Other Ambulatory Visit (HOSPITAL_COMMUNITY)
Admission: RE | Admit: 2020-09-05 | Discharge: 2020-09-05 | Disposition: A | Discharge status: Home / Self Care | Payer: Medicaid Other | Source: Ambulatory Visit | Attending: Obstetrics and Gynecology | Admitting: Obstetrics and Gynecology

## 2020-09-05 ENCOUNTER — Ambulatory Visit (INDEPENDENT_AMBULATORY_CARE_PROVIDER_SITE_OTHER): Payer: Medicaid Other | Admitting: Obstetrics and Gynecology

## 2020-09-05 VITALS — BP 103/64 | HR 80 | Wt 194.3 lb

## 2020-09-05 DIAGNOSIS — N898 Other specified noninflammatory disorders of vagina: Secondary | ICD-10-CM | POA: Diagnosis not present

## 2020-09-05 DIAGNOSIS — O26893 Other specified pregnancy related conditions, third trimester: Secondary | ICD-10-CM

## 2020-09-05 DIAGNOSIS — F1991 Other psychoactive substance use, unspecified, in remission: Secondary | ICD-10-CM

## 2020-09-05 DIAGNOSIS — Z8619 Personal history of other infectious and parasitic diseases: Secondary | ICD-10-CM

## 2020-09-05 DIAGNOSIS — Z6791 Unspecified blood type, Rh negative: Secondary | ICD-10-CM | POA: Insufficient documentation

## 2020-09-05 DIAGNOSIS — O0990 Supervision of high risk pregnancy, unspecified, unspecified trimester: Secondary | ICD-10-CM

## 2020-09-05 DIAGNOSIS — Z3A34 34 weeks gestation of pregnancy: Secondary | ICD-10-CM | POA: Insufficient documentation

## 2020-09-05 DIAGNOSIS — Z87898 Personal history of other specified conditions: Secondary | ICD-10-CM

## 2020-09-05 DIAGNOSIS — O26899 Other specified pregnancy related conditions, unspecified trimester: Secondary | ICD-10-CM | POA: Insufficient documentation

## 2020-09-05 DIAGNOSIS — O43199 Other malformation of placenta, unspecified trimester: Secondary | ICD-10-CM

## 2020-09-05 NOTE — Progress Notes (Signed)
   PRENATAL VISIT NOTE  Subjective:  Glen Blatchley is a 25 y.o. G2P0010 at [redacted]w[redacted]d being seen today for ongoing prenatal care.  She is currently monitored for the following issues for this high-risk pregnancy and has Supervision of high risk pregnancy, antepartum; History of drug use; History of hepatitis C; Marginal insertion of umbilical cord affecting management of mother; History of dental abscess; Pruritus; [redacted] weeks gestation of pregnancy; Vaginal discharge during pregnancy, third trimester; and Rh negative state in antepartum period on their problem list.  Patient doing well with no acute concerns today. She reports no complaints.  Contractions: Not present. Vag. Bleeding: None.  Movement: Present. Denies leaking of fluid.   Pt asked about rhogam as she was unsure if she received it this pregnancy.  Review of chart showed no rhogam administration form 26-28 weeks or later.Will eval antibody status and will give rhogam if negative.  The following portions of the patient's history were reviewed and updated as appropriate: allergies, current medications, past family history, past medical history, past social history, past surgical history and problem list. Problem list updated.  Objective:   Vitals:   09/05/20 1102  BP: 103/64  Pulse: 80  Weight: 194 lb 4.8 oz (88.1 kg)    Fetal Status: Fetal Heart Rate (bpm): 146   Movement: Present     General:  Alert, oriented and cooperative. Patient is in no acute distress.  Skin: Skin is warm and dry. No rash noted.   Cardiovascular: Normal heart rate noted  Respiratory: Normal respiratory effort, no problems with respiration noted  Abdomen: Soft, gravid, appropriate for gestational age.  Pain/Pressure: Absent     Pelvic: Cervical exam deferred        Extremities: Normal range of motion.  Edema: None  Mental Status:  Normal mood and affect. Normal behavior. Normal judgment and thought content.   Assessment and Plan:  Pregnancy: G2P0010  at [redacted]w[redacted]d  1. Vaginal discharge  - Cervicovaginal ancillary only( [place])  2. [redacted] weeks gestation of pregnancy   3. History of hepatitis C   4. Supervision of high risk pregnancy, antepartum continue routine care, GBS swab next visit - ABO AND RH   5. Marginal insertion of umbilical cord affecting management of mother U/s scheduled 7/27  6. History of drug use No report of current use  7. Vaginal discharge during pregnancy, third trimester Self swab taken for evaluation - Cervicovaginal ancillary only( [place])  8. Rh negative state in antepartum period Will check antibody status today, if negative, pt will return this week for rhogam, FOB is O+  Preterm labor symptoms and general obstetric precautions including but not limited to vaginal bleeding, contractions, leaking of fluid and fetal movement were reviewed in detail with the patient.  Please refer to After Visit Summary for other counseling recommendations.   Return in about 2 weeks (around 09/19/2020) for ROB, in person, 36 weeks swabs.   Mariel Aloe, MD Faculty Attending Center for [facility]

## 2020-09-05 NOTE — Progress Notes (Signed)
Patient complains of slight pain in pelvis when she walks for long periods of time. Patient also complains of vaginal discharge along with itching and mild odor

## 2020-09-06 ENCOUNTER — Encounter: Payer: Self-pay | Admitting: *Deleted

## 2020-09-06 ENCOUNTER — Ambulatory Visit (INDEPENDENT_AMBULATORY_CARE_PROVIDER_SITE_OTHER): Payer: Medicaid Other | Admitting: *Deleted

## 2020-09-06 ENCOUNTER — Telehealth: Payer: Self-pay

## 2020-09-06 ENCOUNTER — Other Ambulatory Visit: Payer: Self-pay

## 2020-09-06 ENCOUNTER — Telehealth: Payer: Self-pay | Admitting: *Deleted

## 2020-09-06 VITALS — BP 130/70 | HR 100 | Ht 65.5 in | Wt 194.7 lb

## 2020-09-06 DIAGNOSIS — O26899 Other specified pregnancy related conditions, unspecified trimester: Secondary | ICD-10-CM | POA: Diagnosis not present

## 2020-09-06 DIAGNOSIS — Z6791 Unspecified blood type, Rh negative: Secondary | ICD-10-CM

## 2020-09-06 DIAGNOSIS — O0990 Supervision of high risk pregnancy, unspecified, unspecified trimester: Secondary | ICD-10-CM | POA: Diagnosis not present

## 2020-09-06 DIAGNOSIS — B379 Candidiasis, unspecified: Secondary | ICD-10-CM

## 2020-09-06 LAB — CERVICOVAGINAL ANCILLARY ONLY
Bacterial Vaginitis (gardnerella): NEGATIVE
Candida Glabrata: NEGATIVE
Candida Vaginitis: POSITIVE — AB
Comment: NEGATIVE
Comment: NEGATIVE
Comment: NEGATIVE

## 2020-09-06 LAB — ABO AND RH: Rh Factor: NEGATIVE

## 2020-09-06 LAB — ANTIBODY SCREEN: Antibody Screen: NEGATIVE

## 2020-09-06 MED ORDER — TERCONAZOLE 0.4 % VA CREA: 1.0000 | TOPICAL_CREAM | Freq: Every day | VAGINAL | 0 refills | Status: DC

## 2020-09-06 MED ORDER — RHO D IMMUNE GLOBULIN 1500 UNIT/2ML IJ SOSY
300.0000 ug | PREFILLED_SYRINGE | Freq: Once | INTRAMUSCULAR | Status: AC
  Administered 2020-09-06: 300 ug via INTRAMUSCULAR

## 2020-09-06 NOTE — Telephone Encounter (Signed)
Per Dr. Donavan Foil request called patient to schedule Rhogam injection. Agreed to nurse visit for rhogam today 2:15. Will ask registrar to add to nurse schedule. Charlene Cowdrey,RN

## 2020-09-06 NOTE — Progress Notes (Signed)
Here for nurse visit for rhogam, FHR 155.Rhogam given , patient tolerated without complaint. Advised to wait 15 minutes before leaving in case of any issues.  Amela Handley,RN

## 2020-09-06 NOTE — Telephone Encounter (Addendum)
-----   Message from Warden Fillers, MD sent at 09/06/2020 10:28 AM EDT ----- Pt is A neg, antibodies are negative, please schedule for rhogam this week!   Called pt. Pt would like to come tomorrow for nurse visit for rhogam injection. Front office notified to schedule.   Pt reports concern about what would have happened if mother in law had not brought up concerns. Pt would like injection as soon as possible because this should have been addressed at 28 weeks.

## 2020-09-07 ENCOUNTER — Ambulatory Visit: Payer: Medicaid Other

## 2020-09-07 NOTE — Progress Notes (Signed)
Patient was assessed and managed by nursing staff during this encounter. I have reviewed the chart and agree with the documentation and plan. I have also made any necessary editorial changes.  Warden Fillers, MD 09/07/2020 8:13 AM

## 2020-09-19 ENCOUNTER — Other Ambulatory Visit: Payer: Self-pay

## 2020-09-19 ENCOUNTER — Ambulatory Visit (INDEPENDENT_AMBULATORY_CARE_PROVIDER_SITE_OTHER): Payer: Medicaid Other | Admitting: Advanced Practice Midwife

## 2020-09-19 ENCOUNTER — Other Ambulatory Visit (HOSPITAL_COMMUNITY)
Admission: RE | Admit: 2020-09-19 | Discharge: 2020-09-19 | Disposition: A | Discharge status: Home / Self Care | Payer: Medicaid Other | Source: Ambulatory Visit | Attending: Advanced Practice Midwife | Admitting: Advanced Practice Midwife

## 2020-09-19 VITALS — BP 113/75 | HR 90 | Wt 194.1 lb

## 2020-09-19 DIAGNOSIS — Z6791 Unspecified blood type, Rh negative: Secondary | ICD-10-CM

## 2020-09-19 DIAGNOSIS — O0990 Supervision of high risk pregnancy, unspecified, unspecified trimester: Secondary | ICD-10-CM | POA: Diagnosis present

## 2020-09-19 DIAGNOSIS — Z3A36 36 weeks gestation of pregnancy: Secondary | ICD-10-CM

## 2020-09-19 DIAGNOSIS — Z880 Allergy status to penicillin: Secondary | ICD-10-CM

## 2020-09-19 DIAGNOSIS — O43193 Other malformation of placenta, third trimester: Secondary | ICD-10-CM

## 2020-09-19 DIAGNOSIS — O26899 Other specified pregnancy related conditions, unspecified trimester: Secondary | ICD-10-CM

## 2020-09-20 LAB — CERVICOVAGINAL ANCILLARY ONLY
Chlamydia: NEGATIVE
Comment: NEGATIVE
Comment: NORMAL
Neisseria Gonorrhea: NEGATIVE

## 2020-09-20 NOTE — Progress Notes (Signed)
   PRENATAL VISIT NOTE  Subjective:  Abigail Pittman is a 25 y.o. G2P0010 at [redacted]w[redacted]d being seen today for ongoing prenatal care.  She is currently monitored for the following issues for this high-risk pregnancy and has Supervision of high risk pregnancy, antepartum; History of drug use; History of hepatitis C; Marginal insertion of umbilical cord affecting management of mother; History of dental abscess; Pruritus; Vaginal discharge during pregnancy, third trimester; and Rh negative state in antepartum period on their problem list.  Patient reports occasional contractions.  Contractions: Not present. Vag. Bleeding: None.  Movement: Present. Denies leaking of fluid.   The following portions of the patient's history were reviewed and updated as appropriate: allergies, current medications, past family history, past medical history, past social history, past surgical history and problem list. Problem list updated.  Objective:   Vitals:   09/19/20 1452  BP: 113/75  Pulse: 90  Weight: 194 lb 1.6 oz (88 kg)    Fetal Status: Fetal Heart Rate (bpm): 156   Movement: Present     General:  Alert, oriented and cooperative. Patient is in no acute distress.  Skin: Skin is warm and dry. No rash noted.   Cardiovascular: Normal heart rate noted  Respiratory: Normal respiratory effort, no problems with respiration noted  Abdomen: Soft, gravid, appropriate for gestational age.  Pain/Pressure: Present     Pelvic: Cervical exam performed per patient request closed/thick/ballotable  Extremities: Normal range of motion.  Edema: None  Mental Status: Normal mood and affect. Normal behavior. Normal judgment and thought content.   Assessment and Plan:  Pregnancy: G2P0010 at [redacted]w[redacted]d  1. Supervision of high risk pregnancy, antepartum - Routine care - Discussed typical labor triage in MAU - Perform daily kick counts - Cervicovaginal ancillary only( [place])   2. [redacted] weeks gestation of pregnancy   3.  Penicillin allergy - GBS run with sensitivities - Strep Gp B Culture+Rflx  4. Rh negative state in antepartum period - S/p Rhogam 09/06/2020 - Chart forwarded to [facility] for additional discussion  5. Marginal Cord Insertion - Previously advised to consider IOL at 39 weeks - MFM input, ok to wait for SOL - Patient reassured, does not have to make decision today, continue to discuss next visit  Preterm labor symptoms and general obstetric precautions including but not limited to vaginal bleeding, contractions, leaking of fluid and fetal movement were reviewed in detail with the patient. Please refer to After Visit Summary for other counseling recommendations.  Return in about 1 week (around 09/26/2020).  Future Appointments  Date Time Provider Department Center  09/26/2020  8:30 AM [facility] NURSE [facility]  09/26/2020  8:45 AM [facility] US6 [facility]  09/26/2020 10:15 AM Constant, Gigi Gin, MD [facility]    Calvert Cantor, CNM

## 2020-09-23 ENCOUNTER — Encounter: Payer: Self-pay | Admitting: Advanced Practice Midwife

## 2020-09-23 ENCOUNTER — Telehealth: Payer: Self-pay | Admitting: Advanced Practice Midwife

## 2020-09-23 DIAGNOSIS — Z880 Allergy status to penicillin: Secondary | ICD-10-CM | POA: Insufficient documentation

## 2020-09-23 DIAGNOSIS — Z22330 Carrier of Group B streptococcus: Secondary | ICD-10-CM | POA: Insufficient documentation

## 2020-09-23 LAB — STREP GP B CULTURE+RFLX: Strep Gp B Culture+Rflx: POSITIVE — AB

## 2020-09-23 LAB — STREP GP B SUSCEPTIBILITY

## 2020-09-23 NOTE — Telephone Encounter (Signed)
Patient called, notified of + GBS result, indication for treatment in labor. CNM also noted patient message re: golf ball-sized bleeding and cramping. Patient states those symptoms resolved completely about two days after onset (yesterday). She denies complaints at time of phone call. Reviewed indications for evaluation in MAU. Pt verbalized understanding.  Clayton Bibles, MSN, CNM Certified Nurse Midwife, [facility] for [employer], [facility] 09/23/20 12:54 PM

## 2020-09-26 ENCOUNTER — Ambulatory Visit: Payer: Medicaid Other | Admitting: *Deleted

## 2020-09-26 ENCOUNTER — Encounter: Payer: Self-pay | Admitting: *Deleted

## 2020-09-26 ENCOUNTER — Ambulatory Visit (INDEPENDENT_AMBULATORY_CARE_PROVIDER_SITE_OTHER): Payer: Medicaid Other | Admitting: Obstetrics and Gynecology

## 2020-09-26 ENCOUNTER — Other Ambulatory Visit: Payer: Self-pay

## 2020-09-26 ENCOUNTER — Encounter: Payer: Self-pay | Admitting: Obstetrics and Gynecology

## 2020-09-26 ENCOUNTER — Ambulatory Visit: Payer: Medicaid Other | Attending: Maternal & Fetal Medicine

## 2020-09-26 VITALS — BP 104/63 | HR 91

## 2020-09-26 VITALS — BP 120/75 | HR 80 | Wt 196.6 lb

## 2020-09-26 DIAGNOSIS — Z22330 Carrier of Group B streptococcus: Secondary | ICD-10-CM

## 2020-09-26 DIAGNOSIS — Z87898 Personal history of other specified conditions: Secondary | ICD-10-CM | POA: Diagnosis present

## 2020-09-26 DIAGNOSIS — O99323 Drug use complicating pregnancy, third trimester: Secondary | ICD-10-CM | POA: Diagnosis not present

## 2020-09-26 DIAGNOSIS — O43199 Other malformation of placenta, unspecified trimester: Secondary | ICD-10-CM

## 2020-09-26 DIAGNOSIS — O0990 Supervision of high risk pregnancy, unspecified, unspecified trimester: Secondary | ICD-10-CM

## 2020-09-26 DIAGNOSIS — O26899 Other specified pregnancy related conditions, unspecified trimester: Secondary | ICD-10-CM

## 2020-09-26 DIAGNOSIS — Z6791 Unspecified blood type, Rh negative: Secondary | ICD-10-CM

## 2020-09-26 DIAGNOSIS — O43193 Other malformation of placenta, third trimester: Secondary | ICD-10-CM

## 2020-09-26 DIAGNOSIS — B182 Chronic viral hepatitis C: Secondary | ICD-10-CM

## 2020-09-26 DIAGNOSIS — F1991 Other psychoactive substance use, unspecified, in remission: Secondary | ICD-10-CM

## 2020-09-26 DIAGNOSIS — Z8619 Personal history of other infectious and parasitic diseases: Secondary | ICD-10-CM

## 2020-09-26 DIAGNOSIS — Z3A37 37 weeks gestation of pregnancy: Secondary | ICD-10-CM

## 2020-09-26 DIAGNOSIS — O98413 Viral hepatitis complicating pregnancy, third trimester: Secondary | ICD-10-CM

## 2020-09-26 NOTE — Progress Notes (Signed)
   PRENATAL VISIT NOTE  Subjective:  Abigail Pittman is a 25 y.o. G2P0010 at [redacted]w[redacted]d being seen today for ongoing prenatal care.  She is currently monitored for the following issues for this high-risk pregnancy and has Supervision of high risk pregnancy, antepartum; History of drug use; History of hepatitis C; Marginal insertion of umbilical cord affecting management of mother; History of dental abscess; Pruritus; Vaginal discharge during pregnancy, third trimester; Rh negative state in antepartum period; Penicillin allergy; and GBS carrier on their problem list.  Patient reports no complaints.  Contractions: Not present. Vag. Bleeding: None.  Movement: Present. Denies leaking of fluid.   The following portions of the patient's history were reviewed and updated as appropriate: allergies, current medications, past family history, past medical history, past social history, past surgical history and problem list.   Objective:   Vitals:   09/26/20 1036  BP: 120/75  Pulse: 80  Weight: 196 lb 9.6 oz (89.2 kg)    Fetal Status: Fetal Heart Rate (bpm): 154 Fundal Height: 38 cm Movement: Present     General:  Alert, oriented and cooperative. Patient is in no acute distress.  Skin: Skin is warm and dry. No rash noted.   Cardiovascular: Normal heart rate noted  Respiratory: Normal respiratory effort, no problems with respiration noted  Abdomen: Soft, gravid, appropriate for gestational age.  Pain/Pressure: Present     Pelvic: Cervical exam deferred        Extremities: Normal range of motion.  Edema: Trace  Mental Status: Normal mood and affect. Normal behavior. Normal judgment and thought content.   Assessment and Plan:  Pregnancy: G2P0010 at [redacted]w[redacted]d 1. Supervision of high risk pregnancy, antepartum Patient is doing well without complaints Patient very upset of late administration of rhogam and the fact that she had to bring it to the provider's attention and the lack of follow up from  administration  2. GBS carrier Prophylaxis in labor  3. Rh negative state in antepartum period S/p rhogam on 7/7  4. Marginal insertion of umbilical cord affecting management of mother Plan for postdate IOL  Term labor symptoms and general obstetric precautions including but not limited to vaginal bleeding, contractions, leaking of fluid and fetal movement were reviewed in detail with the patient. Please refer to After Visit Summary for other counseling recommendations.   Return in about 1 week (around 10/03/2020) for in person, ROB, Low risk.  No future appointments.  Catalina Antigua, MD

## 2020-10-03 ENCOUNTER — Other Ambulatory Visit: Payer: Self-pay

## 2020-10-03 ENCOUNTER — Ambulatory Visit (INDEPENDENT_AMBULATORY_CARE_PROVIDER_SITE_OTHER): Payer: Medicaid Other | Admitting: Certified Nurse Midwife

## 2020-10-03 VITALS — BP 112/75 | HR 97 | Wt 200.1 lb

## 2020-10-03 DIAGNOSIS — Z3A38 38 weeks gestation of pregnancy: Secondary | ICD-10-CM

## 2020-10-03 DIAGNOSIS — O0993 Supervision of high risk pregnancy, unspecified, third trimester: Secondary | ICD-10-CM

## 2020-10-05 NOTE — Progress Notes (Signed)
   PRENATAL VISIT NOTE  Subjective:  Abigail Pittman is a 25 y.o. G2P0010 at [redacted]w[redacted]d being seen today for ongoing prenatal care.  She is currently monitored for the following issues for this high-risk pregnancy and has Supervision of high risk pregnancy, antepartum; History of drug use; History of hepatitis C; Marginal insertion of umbilical cord affecting management of mother; History of dental abscess; Pruritus; Vaginal discharge during pregnancy, third trimester; Rh negative state in antepartum period; Penicillin allergy; and GBS carrier on their problem list.  Patient reports no complaints.  Contractions: Irritability. Vag. Bleeding: None.  Movement: Present. Denies leaking of fluid.   The following portions of the patient's history were reviewed and updated as appropriate: allergies, current medications, past family history, past medical history, past social history, past surgical history and problem list.   Objective:   Vitals:   10/03/20 1555  BP: 112/75  Pulse: 97  Weight: 200 lb 1.6 oz (90.8 kg)    Fetal Status: Fetal Heart Rate (bpm): 155 Fundal Height: 39 cm Movement: Present     General:  Alert, oriented and cooperative. Patient is in no acute distress.  Skin: Skin is warm and dry. No rash noted.   Cardiovascular: Normal heart rate noted  Respiratory: Normal respiratory effort, no problems with respiration noted  Abdomen: Soft, gravid, appropriate for gestational age.  Pain/Pressure: Present     Pelvic: Cervical exam deferred        Extremities: Normal range of motion.  Edema: Trace  Mental Status: Normal mood and affect. Normal behavior. Normal judgment and thought content.   Assessment and Plan:  Pregnancy: G2P0010 at [redacted]w[redacted]d Supervision of high risk pregnancy, third trimester - Doing well, feeling regular and vigorous fetal movement  [redacted] weeks gestation of pregnancy - Routine OB care - FOB had questions about setting up IOL today, advised we will set it up if she  hits 40wks without labor since she has no medical indication for an earlier induction. He expressed understanding and agreed to plan.  Term labor symptoms and general obstetric precautions including but not limited to vaginal bleeding, contractions, leaking of fluid and fetal movement were reviewed in detail with the patient. Please refer to After Visit Summary for other counseling recommendations.   Return in about 1 week (around 10/10/2020) for IN-PERSON, LOB.  Future Appointments  Date Time Provider Department Center  10/10/2020  1:15 PM Currie Paris, NP [facility]    Bernerd Limbo, CNM

## 2020-10-10 ENCOUNTER — Ambulatory Visit (INDEPENDENT_AMBULATORY_CARE_PROVIDER_SITE_OTHER): Payer: Medicaid Other | Admitting: Nurse Practitioner

## 2020-10-10 ENCOUNTER — Other Ambulatory Visit: Payer: Self-pay

## 2020-10-10 VITALS — BP 126/85 | HR 89 | Wt 200.5 lb

## 2020-10-10 DIAGNOSIS — O0990 Supervision of high risk pregnancy, unspecified, unspecified trimester: Secondary | ICD-10-CM

## 2020-10-10 DIAGNOSIS — O0993 Supervision of high risk pregnancy, unspecified, third trimester: Secondary | ICD-10-CM

## 2020-10-10 DIAGNOSIS — Z3A39 39 weeks gestation of pregnancy: Secondary | ICD-10-CM

## 2020-10-10 DIAGNOSIS — Z22330 Carrier of Group B streptococcus: Secondary | ICD-10-CM

## 2020-10-10 NOTE — Progress Notes (Signed)
    Subjective:  Abigail Pittman is a 25 y.o. G2P0010 at [redacted]w[redacted]d being seen today for ongoing prenatal care.  She is currently monitored for the following issues for this low-risk pregnancy and has Supervision of high risk pregnancy, antepartum; History of drug use; History of hepatitis C; Marginal insertion of umbilical cord affecting management of mother; History of dental abscess; Pruritus; Rh negative state in antepartum period; Penicillin allergy; and GBS carrier on their problem list.  Patient reports no complaints.  Contractions: Not present. Vag. Bleeding: None.  Movement: Present. Denies leaking of fluid.   The following portions of the patient's history were reviewed and updated as appropriate: allergies, current medications, past family history, past medical history, past social history, past surgical history and problem list. Problem list updated.  Objective:   Vitals:   10/10/20 1328  BP: 126/85  Pulse: 89  Weight: 200 lb 8 oz (90.9 kg)    Fetal Status: Fetal Heart Rate (bpm): 138 Fundal Height: 40 cm Movement: Present     General:  Alert, oriented and cooperative. Patient is in no acute distress.  Skin: Skin is warm and dry. No rash noted.   Cardiovascular: Normal heart rate noted  Respiratory: Normal respiratory effort, no problems with respiration noted  Abdomen: Soft, gravid, appropriate for gestational age. Pain/Pressure: Present     Pelvic:  Cervical exam deferred        Extremities: Normal range of motion.  Edema: Trace  Mental Status: Normal mood and affect. Normal behavior. Normal judgment and thought content.   Urinalysis:      Assessment and Plan:  Pregnancy: G2P0010 at [redacted]w[redacted]d  1. Supervision of high risk pregnancy in third trimester Hx of Hep C - to see GI PP Discussed scheduling induction at 41 weeks Doing well.  Baby moving well.  2. GBS carrier Will treat in labor  3. [redacted] weeks gestation of pregnancy    Term labor symptoms and general  obstetric precautions including but not limited to vaginal bleeding, contractions, leaking of fluid and fetal movement were reviewed in detail with the patient. Please refer to After Visit Summary for other counseling recommendations.  Return in about 1 week (around 10/17/2020) for in person ROB.  Nolene Bernheim, RN, MSN, NP-BC Nurse Practitioner, [facility] for [employer], [facility] 10/10/2020 8:28 PM

## 2020-10-10 NOTE — Addendum Note (Signed)
Addended by: Currie Paris on: 10/10/2020 08:36 PM   Modules accepted: Orders, SmartSet

## 2020-10-15 ENCOUNTER — Telehealth (HOSPITAL_COMMUNITY): Payer: Self-pay | Admitting: *Deleted

## 2020-10-15 ENCOUNTER — Encounter (HOSPITAL_COMMUNITY): Payer: Self-pay | Admitting: *Deleted

## 2020-10-15 ENCOUNTER — Encounter (HOSPITAL_COMMUNITY): Payer: Self-pay

## 2020-10-15 NOTE — Telephone Encounter (Signed)
Preadmission screen  

## 2020-10-16 ENCOUNTER — Ambulatory Visit (INDEPENDENT_AMBULATORY_CARE_PROVIDER_SITE_OTHER): Payer: Medicaid Other

## 2020-10-16 ENCOUNTER — Encounter: Payer: Self-pay | Admitting: Family Medicine

## 2020-10-16 ENCOUNTER — Other Ambulatory Visit: Payer: Self-pay

## 2020-10-16 ENCOUNTER — Ambulatory Visit (INDEPENDENT_AMBULATORY_CARE_PROVIDER_SITE_OTHER): Payer: Medicaid Other | Admitting: Family Medicine

## 2020-10-16 VITALS — BP 113/71 | HR 94 | Wt 197.9 lb

## 2020-10-16 DIAGNOSIS — Z6791 Unspecified blood type, Rh negative: Secondary | ICD-10-CM

## 2020-10-16 DIAGNOSIS — O480 Post-term pregnancy: Secondary | ICD-10-CM

## 2020-10-16 DIAGNOSIS — O26899 Other specified pregnancy related conditions, unspecified trimester: Secondary | ICD-10-CM

## 2020-10-16 DIAGNOSIS — O0990 Supervision of high risk pregnancy, unspecified, unspecified trimester: Secondary | ICD-10-CM

## 2020-10-16 DIAGNOSIS — Z8619 Personal history of other infectious and parasitic diseases: Secondary | ICD-10-CM

## 2020-10-16 DIAGNOSIS — Z22330 Carrier of Group B streptococcus: Secondary | ICD-10-CM

## 2020-10-16 DIAGNOSIS — O0993 Supervision of high risk pregnancy, unspecified, third trimester: Secondary | ICD-10-CM

## 2020-10-16 DIAGNOSIS — Z880 Allergy status to penicillin: Secondary | ICD-10-CM

## 2020-10-16 DIAGNOSIS — O43199 Other malformation of placenta, unspecified trimester: Secondary | ICD-10-CM

## 2020-10-16 NOTE — Progress Notes (Signed)
   Subjective:  Abigail Pittman is a 25 y.o. G2P0010 at [redacted]w[redacted]d being seen today for ongoing prenatal care.  She is currently monitored for the following issues for this high-risk pregnancy and has Supervision of high risk pregnancy, antepartum; History of drug use; History of hepatitis C; Marginal insertion of umbilical cord affecting management of mother; History of dental abscess; Pruritus; Rh negative state in antepartum period; Penicillin allergy; and GBS carrier on their problem list.  Patient reports no complaints.  Contractions: Not present. Vag. Bleeding: None.  Movement: Present. Denies leaking of fluid.   The following portions of the patient's history were reviewed and updated as appropriate: allergies, current medications, past family history, past medical history, past social history, past surgical history and problem list. Problem list updated.  Objective:   Vitals:   10/16/20 0924  BP: 113/71  Pulse: 94  Weight: 197 lb 14.4 oz (89.8 kg)    Fetal Status: Fetal Heart Rate (bpm): 135   Movement: Present  Presentation: Vertex  General:  Alert, oriented and cooperative. Patient is in no acute distress.  Skin: Skin is warm and dry. No rash noted.   Cardiovascular: Normal heart rate noted  Respiratory: Normal respiratory effort, no problems with respiration noted  Abdomen: Soft, gravid, appropriate for gestational age. Pain/Pressure: Absent     Pelvic: Vag. Bleeding: None     Cervical exam deferred        Extremities: Normal range of motion.  Edema: Trace  Mental Status: Normal mood and affect. Normal behavior. Normal judgment and thought content.   Urinalysis:      Assessment and Plan:  Pregnancy: G2P0010 at [redacted]w[redacted]d  1. Supervision of high risk pregnancy in third trimester BP and FHR normal BPP done today which is 10/10 Scheduled for IOL at 41 weeks Discussed contraception, still considering IUD vs Depo  2. Post term pregnancy, antepartum condition or  complication  - US FETAL BPP W/NONSTRESS; Future  3. Supervision of high risk pregnancy, antepartum   4. History of hepatitis C Positive viral load on prenatal labs Follow up w GI postpartum  5. Marginal insertion of umbilical cord affecting management of mother Normal growth [place] this pregnancy  6. Rh negative state in antepartum period Received rhogam 09/06/20  7. Penicillin allergy Hives per EMR  8. 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 Please refer to After Visit Summary for other counseling recommendations.  Return in 6 weeks (on 11/27/2020) for PP check.   Venora Maples, MD

## 2020-10-16 NOTE — Patient Instructions (Signed)

## 2020-10-17 ENCOUNTER — Other Ambulatory Visit: Payer: Self-pay | Admitting: Advanced Practice Midwife

## 2020-10-19 ENCOUNTER — Other Ambulatory Visit: Payer: Self-pay | Admitting: Obstetrics & Gynecology

## 2020-10-19 LAB — SARS CORONAVIRUS 2 (TAT 6-24 HRS): SARS Coronavirus 2: NEGATIVE

## 2020-10-22 ENCOUNTER — Encounter (HOSPITAL_COMMUNITY): Payer: Self-pay | Admitting: Family Medicine

## 2020-10-22 ENCOUNTER — Other Ambulatory Visit: Payer: Self-pay

## 2020-10-22 ENCOUNTER — Inpatient Hospital Stay (HOSPITAL_COMMUNITY)
Admission: AD | Admit: 2020-10-22 | Discharge: 2020-10-25 | DRG: 806 | Disposition: A | Discharge status: Home / Self Care | Payer: Medicaid Other | Attending: Family Medicine | Admitting: Family Medicine

## 2020-10-22 ENCOUNTER — Inpatient Hospital Stay (HOSPITAL_COMMUNITY): Payer: Medicaid Other

## 2020-10-22 DIAGNOSIS — O26893 Other specified pregnancy related conditions, third trimester: Secondary | ICD-10-CM | POA: Diagnosis present

## 2020-10-22 DIAGNOSIS — Z8619 Personal history of other infectious and parasitic diseases: Secondary | ICD-10-CM | POA: Diagnosis present

## 2020-10-22 DIAGNOSIS — Z30430 Encounter for insertion of intrauterine contraceptive device: Secondary | ICD-10-CM | POA: Diagnosis not present

## 2020-10-22 DIAGNOSIS — O99824 Streptococcus B carrier state complicating childbirth: Secondary | ICD-10-CM | POA: Diagnosis present

## 2020-10-22 DIAGNOSIS — Z3A41 41 weeks gestation of pregnancy: Secondary | ICD-10-CM

## 2020-10-22 DIAGNOSIS — Z87891 Personal history of nicotine dependence: Secondary | ICD-10-CM | POA: Diagnosis not present

## 2020-10-22 DIAGNOSIS — O480 Post-term pregnancy: Secondary | ICD-10-CM | POA: Diagnosis present

## 2020-10-22 DIAGNOSIS — O9842 Viral hepatitis complicating childbirth: Secondary | ICD-10-CM | POA: Diagnosis present

## 2020-10-22 DIAGNOSIS — R451 Restlessness and agitation: Secondary | ICD-10-CM | POA: Diagnosis not present

## 2020-10-22 DIAGNOSIS — Z6791 Unspecified blood type, Rh negative: Secondary | ICD-10-CM | POA: Diagnosis not present

## 2020-10-22 DIAGNOSIS — Z30014 Encounter for initial prescription of intrauterine contraceptive device: Secondary | ICD-10-CM | POA: Diagnosis not present

## 2020-10-22 DIAGNOSIS — O43199 Other malformation of placenta, unspecified trimester: Secondary | ICD-10-CM | POA: Diagnosis present

## 2020-10-22 DIAGNOSIS — Z880 Allergy status to penicillin: Secondary | ICD-10-CM | POA: Diagnosis not present

## 2020-10-22 DIAGNOSIS — B1920 Unspecified viral hepatitis C without hepatic coma: Secondary | ICD-10-CM | POA: Diagnosis present

## 2020-10-22 DIAGNOSIS — O43123 Velamentous insertion of umbilical cord, third trimester: Secondary | ICD-10-CM | POA: Diagnosis present

## 2020-10-22 DIAGNOSIS — Z22330 Carrier of Group B streptococcus: Secondary | ICD-10-CM

## 2020-10-22 LAB — TYPE AND SCREEN
ABO/RH(D): A NEG
Antibody Screen: POSITIVE

## 2020-10-22 LAB — CBC
HCT: 37.9 % (ref 36.0–46.0)
Hemoglobin: 12.8 g/dL (ref 12.0–15.0)
MCH: 31.3 pg (ref 26.0–34.0)
MCHC: 33.8 g/dL (ref 30.0–36.0)
MCV: 92.7 fL (ref 80.0–100.0)
Platelets: 259 10*3/uL (ref 150–400)
RBC: 4.09 MIL/uL (ref 3.87–5.11)
RDW: 13.3 % (ref 11.5–15.5)
WBC: 8 10*3/uL (ref 4.0–10.5)
nRBC: 0 % (ref 0.0–0.2)

## 2020-10-22 MED ORDER — ACETAMINOPHEN 500 MG PO TABS
1000.0000 mg | ORAL_TABLET | Freq: Four times a day (QID) | ORAL | Status: DC | PRN
  Administered 2020-10-22: 1000 mg via ORAL
  Filled 2020-10-22: qty 2

## 2020-10-22 MED ORDER — OXYTOCIN-SODIUM CHLORIDE 30-0.9 UT/500ML-% IV SOLN
2.5000 [IU]/h | INTRAVENOUS | Status: DC
  Administered 2020-10-23: 2.5 [IU]/h via INTRAVENOUS

## 2020-10-22 MED ORDER — ACETAMINOPHEN 325 MG PO TABS: 650.0000 mg | ORAL_TABLET | ORAL | Status: DC | PRN

## 2020-10-22 MED ORDER — OXYCODONE-ACETAMINOPHEN 5-325 MG PO TABS: 1.0000 | ORAL_TABLET | ORAL | Status: DC | PRN

## 2020-10-22 MED ORDER — ONDANSETRON HCL 4 MG/2ML IJ SOLN: 4.0000 mg | Freq: Four times a day (QID) | INTRAMUSCULAR | Status: DC | PRN

## 2020-10-22 MED ORDER — HYDROXYZINE HCL 25 MG PO TABS
25.0000 mg | ORAL_TABLET | Freq: Three times a day (TID) | ORAL | Status: DC | PRN
  Administered 2020-10-22: 25 mg via ORAL
  Filled 2020-10-22: qty 1

## 2020-10-22 MED ORDER — MISOPROSTOL 25 MCG QUARTER TABLET: 25.0000 ug | ORAL_TABLET | ORAL | Status: DC | PRN

## 2020-10-22 MED ORDER — OXYTOCIN-SODIUM CHLORIDE 30-0.9 UT/500ML-% IV SOLN
1.0000 m[IU]/min | INTRAVENOUS | Status: DC
  Administered 2020-10-23: 2 m[IU]/min via INTRAVENOUS
  Filled 2020-10-22: qty 500

## 2020-10-22 MED ORDER — CEFAZOLIN SODIUM-DEXTROSE 1-4 GM/50ML-% IV SOLN
1.0000 g | Freq: Three times a day (TID) | INTRAVENOUS | Status: DC
  Administered 2020-10-23 (×2): 1 g via INTRAVENOUS
  Filled 2020-10-22 (×5): qty 50

## 2020-10-22 MED ORDER — MISOPROSTOL 50MCG HALF TABLET
50.0000 ug | ORAL_TABLET | ORAL | Status: DC
Start: 2020-10-22 — End: 2020-10-23
  Administered 2020-10-22 (×2): 50 ug via BUCCAL
  Filled 2020-10-22: qty 1

## 2020-10-22 MED ORDER — LACTATED RINGERS IV SOLN: INTRAVENOUS | Status: DC

## 2020-10-22 MED ORDER — SOD CITRATE-CITRIC ACID 500-334 MG/5ML PO SOLN: 30.0000 mL | ORAL | Status: DC | PRN

## 2020-10-22 MED ORDER — MISOPROSTOL 50MCG HALF TABLET
ORAL_TABLET | ORAL | Status: AC
  Filled 2020-10-22: qty 1

## 2020-10-22 MED ORDER — LIDOCAINE HCL (PF) 1 % IJ SOLN: 30.0000 mL | INTRAMUSCULAR | Status: DC | PRN

## 2020-10-22 MED ORDER — OXYCODONE-ACETAMINOPHEN 5-325 MG PO TABS: 2.0000 | ORAL_TABLET | ORAL | Status: DC | PRN

## 2020-10-22 MED ORDER — VANCOMYCIN HCL IN DEXTROSE 1-5 GM/200ML-% IV SOLN: 1000.0000 mg | Freq: Two times a day (BID) | INTRAVENOUS | Status: DC

## 2020-10-22 MED ORDER — CEFAZOLIN SODIUM-DEXTROSE 2-4 GM/100ML-% IV SOLN
2.0000 g | Freq: Once | INTRAVENOUS | Status: AC
  Administered 2020-10-22: 2 g via INTRAVENOUS
  Filled 2020-10-22: qty 100

## 2020-10-22 MED ORDER — LACTATED RINGERS IV SOLN
500.0000 mL | INTRAVENOUS | Status: DC | PRN
  Administered 2020-10-23: 500 mL via INTRAVENOUS

## 2020-10-22 MED ORDER — FLEET ENEMA 7-19 GM/118ML RE ENEM: 1.0000 | ENEMA | RECTAL | Status: DC | PRN

## 2020-10-22 MED ORDER — LEVONORGESTREL 20.1 MCG/DAY IU IUD
1.0000 | INTRAUTERINE_SYSTEM | Freq: Once | INTRAUTERINE | Status: AC
  Administered 2020-10-23: 1 via INTRAUTERINE
  Filled 2020-10-22: qty 1

## 2020-10-22 MED ORDER — TERBUTALINE SULFATE 1 MG/ML IJ SOLN: 0.2500 mg | Freq: Once | INTRAMUSCULAR | Status: DC | PRN

## 2020-10-22 MED ORDER — OXYTOCIN BOLUS FROM INFUSION
333.0000 mL | Freq: Once | INTRAVENOUS | Status: AC
  Administered 2020-10-23: 333 mL via INTRAVENOUS

## 2020-10-22 NOTE — Progress Notes (Signed)
Labor Progress Note Abigail Pittman is a 25 y.o. G2P0010 at [redacted]w[redacted]d presented for IOL for post dates. S: Indicates not feeling too bad.  O:  BP 132/82   Pulse 76   Temp 98.2 F (36.8 C) (Oral)   Resp 18   Ht 5' 5.5" (1.664 m)   Wt 91.2 kg   LMP 01/09/2020 (Exact Date)   BMI 32.94 kg/m  EFM: 130/Moderate/ + Accels, - Deccels  CVE: Dilation: 3 Effacement (%): 50 Cervical Position: Posterior Station: -2 Presentation: Vertex Exam by:: Chilton Si   A&P: 25 y.o. G2P0010 [redacted]w[redacted]d presented for IOL for post dates. #Labor: Progressing well. Foley out.  Will give 2nd dose of cytotec dose and monitor progression. #Pain: No narcotics, Epidural PRN #FWB: Cat 1 #GBS positive, on Cefazolin. #Restlessness:  Will give Vistaril to aid with sleep. #Hep C:  No FSE during labor, will need GI/ID work-up post partum.  Jovita Kussmaul, MD 9:53 PM

## 2020-10-22 NOTE — Progress Notes (Signed)
Foley removed d/t pt request (too much pain).  Pain decreased after removal.  Cx 3/50/-2.  Will check cx around 2100 and make a plan after that.

## 2020-10-22 NOTE — Progress Notes (Signed)
Labor Progress Note Richa Shor is a 25 y.o. G2P0010 at [redacted]w[redacted]d presented for post-dates induction of labor.  S: Has been getting up/walking. Discussed risks/benefits of foley bulb to augment cervical ripening and she is amenable.  O:  BP 124/75   Pulse 98   Temp 98.2 F (36.8 C) (Oral)   Resp 16   Ht 5' 5.5" (1.664 m)   Wt 91.2 kg   LMP 01/09/2020 (Exact Date)   BMI 32.94 kg/m  EFM: 140/moderate/acceleration  CVE: Dilation: 1.5 Effacement (%): 50 Cervical Position: Posterior Station: -3 Presentation: Vertex Exam by:: Dr. Marisue Humble   A&P: 25 y.o. G2P0010 [redacted]w[redacted]d here for post-dates induction of labor. #Labor: Foley bulb placed at 1645 for augmentation of cervical ripening.  #Pain: Had a fair amount of pain with foley bulb insertion, however, desires to avoid narcotics if possible #FWB: Cat I #GBS positive- ancef running Hep C- no FSE   [name], MD 7:00 PM

## 2020-10-22 NOTE — H&P (Addendum)
OBSTETRIC ADMISSION HISTORY AND PHYSICAL  Abigail Pittman is a 25 y.o. female G2P0010 with IUP at [redacted]w[redacted]d by LMP c/w 8wk [place] presenting for post-dates induction of labor. She reports +FMs, No LOF, no VB, no blurry vision, headaches or peripheral edema, and RUQ pain.  She plans on breast feeding. She request post-placental IUD for birth control. She received her prenatal care at [facility]   Dating: By LMP c/w 8wk [place] --->  Estimated Date of Delivery: 10/15/20  Sono:    @[redacted]w[redacted]d , CWD, normal anatomy, Cephalic presentation, 2871g, 06-27-1988 EFW   Prenatal History/Complications: Marginal cord but normal growth 69% + Hep C quant in pregnancy History of IV drug use  Past Medical History: Past Medical History:  Diagnosis Date   COVID-19 03/2020   Hepatitis C    History of drug use    Threatened miscarriage 02/17/2020   UTI (urinary tract infection)     Past Surgical History: Past Surgical History:  Procedure Laterality Date   SHOULDER SURGERY Left     Obstetrical History: OB History     Gravida  2   Para  0   Term  0   Preterm  0   AB  1   Living  0      SAB  1   IAB  0   Ectopic  0   Multiple  0   Live Births  0           Social History Social History   Socioeconomic History   Marital status: Single    Spouse name: Not on file   Number of children: Not on file   Years of education: Not on file   Highest education level: Not on file  Occupational History   Not on file  Tobacco Use   Smoking status: Former    Packs/day: 1.00    Types: Cigarettes    Quit date: 02/09/2020    Years since quitting: 0.7   Smokeless tobacco: Never  Vaping Use   Vaping Use: Former   Quit date: 08/26/2019   Substances: Nicotine-salt  Substance and Sexual Activity   Alcohol use: Not Currently   Drug use: Not Currently    Types: Marijuana, Methamphetamines, Heroin    Comment: 03/27/20 6 months free   Sexual activity: Yes    Birth control/protection: None  Other Topics Concern    Not on file  Social History Narrative   Not on file   Social Determinants of Health   Financial Resource Strain: Not on file  Food Insecurity: No Food Insecurity   Worried About Running Out of Food in the Last Year: Never true   Ran Out of Food in the Last Year: Never true  Transportation Needs: No Transportation Needs   Lack of Transportation (Medical): No   Lack of Transportation (Non-Medical): No  Physical Activity: Not on file  Stress: Not on file  Social Connections: Not on file    Family History: Family History  Problem Relation Age of Onset   Diverticulitis Mother    Pancreatic cancer Father    Skin cancer Father    Colon cancer Father     Allergies: Allergies  Allergen Reactions   Penicillins Hives    07/2020: took keflex okay?    Medications Prior to Admission  Medication Sig Dispense Refill Last Dose   clotrimazole (LOTRIMIN) 1 % cream Apply to affected area 2 times daily 15 g 0 Past Month   Prenatal Vit-Fe Fumarate-FA (PRENATAL VITAMINS PO) Take  1 tablet by mouth daily.   10/22/2020   acetaminophen (TYLENOL) 325 MG tablet Take 650 mg by mouth every 6 (six) hours as needed.   More than a month   docusate sodium (COLACE) 100 MG capsule Take 1 capsule (100 mg total) by mouth 2 (two) times daily. 60 capsule 0 Unknown     Review of Systems   All systems reviewed and negative except as stated in HPI  Last menstrual period 01/09/2020, unknown if currently breastfeeding. General appearance: alert, cooperative, and no distress Lungs: normal work of breathing on room air Heart: regular rate and rhythm Abdomen: soft, non-tender; bowel sounds normal Extremities: Homans sign is negative, no sign of DVT Presentation: cephalic Fetal monitoringBaseline: 140 bpm, Variability: Good {> 6 bpm), and Accelerations: Reactive Uterine activityNone Dilation: 1.5 Effacement (%): 50 Station: -3 Exam by:: Valarie Merino RN   Prenatal labs: ABO, Rh: A/Negative/-- (07/06  1159) Antibody: Negative (07/06 1159) Rubella: 1.16 (01/31 0950) RPR: Non Reactive (05/25 0949)  HBsAg: Negative (01/31 0950)  HIV: Non Reactive (05/25 0949)  GBS: Positive/-- (07/20 1524)  1 hr Glucola WNL Genetic screening  Low risk NIPS Anatomy US Normal anatomy, serial growth [place] also nl  Prenatal Transfer Tool  Maternal Diabetes: No Genetic Screening: Normal Maternal Ultrasounds/Referrals: Other: marginal cord insertion Fetal Ultrasounds or other Referrals:  None Maternal Substance Abuse:  No Significant Maternal Medications:  None Significant Maternal Lab Results: Group B Strep positive, PCN allergy, clinda resistant, has had cephalosporins in recent history   No results found for this or any previous visit (from the past 24 hour(s)).  Patient Active Problem List   Diagnosis Date Noted   Post term pregnancy at [redacted] weeks gestation 10/22/2020   Penicillin allergy 09/23/2020   GBS carrier 09/23/2020   Rh negative state in antepartum period 09/05/2020   Pruritus 08/22/2020   History of dental abscess 07/25/2020   Marginal insertion of umbilical cord affecting management of mother 05/23/2020   Supervision of high risk pregnancy, antepartum 03/27/2020   History of drug use 03/27/2020   History of hepatitis C 03/27/2020    Assessment/Plan:  Abigail Pittman is a 25 y.o. G2P0010 at [redacted]w[redacted]d here for post-dates induction of labor.   #Labor: Not having contractions at this time. Will start cytotec. May consider foley at next check if making progress.  #Pain: Would NOT like oral/IV narcotics, but open to epidural. #FWB: Cat I #ID:  GBS pos, PCN allergy, clinda resistant > Keflex  #MOF: Breast #MOC:ppIUD #Circ:  N/a girl  Hx of substance abuse-- SW postpartum Hepatitis C-- Will need GI followup postpartum   - No FSE during labor   [name], MD  10/22/2020, 5:36 PM    Attestation of Supervision of Student:  I confirm that I have verified the information documented  in the  resident's  note and that I have also personally reperformed the history, physical exam and all medical decision making activities.  I have verified that all services and findings are accurately documented in this student's note; and I agree with management and plan as outlined in the documentation. I have also made any necessary editorial changes.  Thressa Sheller DNP, CNM  10/22/20  5:52 PM

## 2020-10-23 ENCOUNTER — Inpatient Hospital Stay (HOSPITAL_COMMUNITY): Payer: Medicaid Other | Admitting: Anesthesiology

## 2020-10-23 ENCOUNTER — Encounter (HOSPITAL_COMMUNITY): Payer: Self-pay | Admitting: Family Medicine

## 2020-10-23 DIAGNOSIS — Z3A41 41 weeks gestation of pregnancy: Secondary | ICD-10-CM

## 2020-10-23 DIAGNOSIS — O99824 Streptococcus B carrier state complicating childbirth: Secondary | ICD-10-CM

## 2020-10-23 DIAGNOSIS — O480 Post-term pregnancy: Secondary | ICD-10-CM

## 2020-10-23 DIAGNOSIS — Z30014 Encounter for initial prescription of intrauterine contraceptive device: Secondary | ICD-10-CM

## 2020-10-23 LAB — RPR: RPR Ser Ql: NONREACTIVE

## 2020-10-23 MED ORDER — CALCIUM CARBONATE ANTACID 500 MG PO CHEW
1.0000 | CHEWABLE_TABLET | Freq: Every day | ORAL | Status: DC
  Filled 2020-10-23: qty 1

## 2020-10-23 MED ORDER — BENZOCAINE-MENTHOL 20-0.5 % EX AERO
1.0000 "application " | INHALATION_SPRAY | CUTANEOUS | Status: DC | PRN
  Administered 2020-10-23: 1 via TOPICAL
  Filled 2020-10-23: qty 56

## 2020-10-23 MED ORDER — PHENYLEPHRINE 40 MCG/ML (10ML) SYRINGE FOR IV PUSH (FOR BLOOD PRESSURE SUPPORT)
80.0000 ug | PREFILLED_SYRINGE | INTRAVENOUS | Status: DC | PRN
Start: 2020-10-23 — End: 2020-10-23

## 2020-10-23 MED ORDER — FENTANYL-BUPIVACAINE-NACL 0.5-0.125-0.9 MG/250ML-% EP SOLN
12.0000 mL/h | EPIDURAL | Status: DC | PRN
  Administered 2020-10-23: 12 mL/h via EPIDURAL
  Filled 2020-10-23: qty 250

## 2020-10-23 MED ORDER — PRENATAL MULTIVITAMIN CH
1.0000 | ORAL_TABLET | Freq: Every day | ORAL | Status: DC
  Administered 2020-10-24: 1 via ORAL
  Filled 2020-10-23: qty 1

## 2020-10-23 MED ORDER — PHENYLEPHRINE 40 MCG/ML (10ML) SYRINGE FOR IV PUSH (FOR BLOOD PRESSURE SUPPORT): 80.0000 ug | PREFILLED_SYRINGE | INTRAVENOUS | Status: DC | PRN

## 2020-10-23 MED ORDER — ONDANSETRON HCL 4 MG PO TABS: 4.0000 mg | ORAL_TABLET | ORAL | Status: DC | PRN

## 2020-10-23 MED ORDER — ACETAMINOPHEN 325 MG PO TABS: 650.0000 mg | ORAL_TABLET | ORAL | Status: DC | PRN

## 2020-10-23 MED ORDER — DIBUCAINE (PERIANAL) 1 % EX OINT: 1.0000 "application " | TOPICAL_OINTMENT | CUTANEOUS | Status: DC | PRN

## 2020-10-23 MED ORDER — WITCH HAZEL-GLYCERIN EX PADS: 1.0000 "application " | MEDICATED_PAD | CUTANEOUS | Status: DC | PRN

## 2020-10-23 MED ORDER — ZOLPIDEM TARTRATE 5 MG PO TABS: 5.0000 mg | ORAL_TABLET | Freq: Every evening | ORAL | Status: DC | PRN

## 2020-10-23 MED ORDER — DIPHENHYDRAMINE HCL 50 MG/ML IJ SOLN: 12.5000 mg | INTRAMUSCULAR | Status: DC | PRN

## 2020-10-23 MED ORDER — FENTANYL-BUPIVACAINE-NACL 0.5-0.125-0.9 MG/250ML-% EP SOLN: 12.0000 mL/h | EPIDURAL | Status: DC | PRN

## 2020-10-23 MED ORDER — EPHEDRINE 5 MG/ML INJ: 10.0000 mg | INTRAVENOUS | Status: DC | PRN

## 2020-10-23 MED ORDER — LACTATED RINGERS IV SOLN: 500.0000 mL | Freq: Once | INTRAVENOUS | Status: DC

## 2020-10-23 MED ORDER — TETANUS-DIPHTH-ACELL PERTUSSIS 5-2.5-18.5 LF-MCG/0.5 IM SUSY: 0.5000 mL | PREFILLED_SYRINGE | Freq: Once | INTRAMUSCULAR | Status: DC

## 2020-10-23 MED ORDER — DIPHENHYDRAMINE HCL 50 MG/ML IJ SOLN
12.5000 mg | INTRAMUSCULAR | Status: DC | PRN
Start: 2020-10-23 — End: 2020-10-23

## 2020-10-23 MED ORDER — SENNOSIDES-DOCUSATE SODIUM 8.6-50 MG PO TABS
2.0000 | ORAL_TABLET | Freq: Every day | ORAL | Status: DC
  Administered 2020-10-24: 2 via ORAL
  Filled 2020-10-23: qty 2

## 2020-10-23 MED ORDER — DIPHENHYDRAMINE HCL 25 MG PO CAPS: 25.0000 mg | ORAL_CAPSULE | Freq: Four times a day (QID) | ORAL | Status: DC | PRN

## 2020-10-23 MED ORDER — LIDOCAINE HCL (PF) 1 % IJ SOLN
INTRAMUSCULAR | Status: DC | PRN
  Administered 2020-10-23: 8 mL via EPIDURAL

## 2020-10-23 MED ORDER — IBUPROFEN 600 MG PO TABS
600.0000 mg | ORAL_TABLET | Freq: Four times a day (QID) | ORAL | Status: DC
  Administered 2020-10-23 – 2020-10-25 (×7): 600 mg via ORAL
  Filled 2020-10-23 (×7): qty 1

## 2020-10-23 MED ORDER — SIMETHICONE 80 MG PO CHEW: 80.0000 mg | CHEWABLE_TABLET | ORAL | Status: DC | PRN

## 2020-10-23 MED ORDER — COCONUT OIL OIL
1.0000 "application " | TOPICAL_OIL | Status: DC | PRN
  Administered 2020-10-24: 1 via TOPICAL

## 2020-10-23 MED ORDER — ONDANSETRON HCL 4 MG/2ML IJ SOLN: 4.0000 mg | INTRAMUSCULAR | Status: DC | PRN

## 2020-10-23 NOTE — Anesthesia Procedure Notes (Signed)
Epidural Patient location during procedure: OB Start time: 10/23/2020 2:35 AM End time: 10/23/2020 2:39 AM  Preanesthetic Checklist Completed: patient identified, IV checked, site marked, risks and benefits discussed, surgical consent, monitors and equipment checked, pre-op evaluation and timeout performed  Epidural Patient position: sitting Prep: DuraPrep and site prepped and draped Patient monitoring: continuous pulse ox and blood pressure Approach: midline Location: L3-L4 Injection technique: LOR air  Needle:  Needle type: Tuohy  Needle gauge: 17 G Needle length: 9 cm and 9 Needle insertion depth: 6 cm Catheter type: closed end flexible Catheter size: 19 Gauge Catheter at skin depth: 12 cm Test dose: negative  Assessment Events: blood not aspirated, injection not painful, no injection resistance, no paresthesia and negative IV test

## 2020-10-23 NOTE — Progress Notes (Signed)
Labor Progress Note Abigail Pittman is a 25 y.o. G2P0010 at [redacted]w[redacted]d presented for post-dates IOL S: Patient reports great pain relief with epidural in place.  Decels noted on the monitor with patient on her back, having difficulty tracing contractions with toco while patient is on her side.  O:  BP 122/79   Pulse 62   Temp 97.8 F (36.6 C) (Oral)   Resp 18   Ht 5' 5.5" (1.664 m)   Wt 91.2 kg   LMP 01/09/2020 (Exact Date)   BMI 32.94 kg/m  EFM: 155/<5 BPM/No accels/decels  CVE: Dilation: 5 Effacement (%): 80 Cervical Position: Posterior Station: -1 Presentation: Vertex Exam by:: Sandy Salaam, RN   A&P: 25 y.o. G2P0010 [redacted]w[redacted]d  #Labor: Progressing appropriately, AROM at 0900 with light meconium. IUPC placed for closer monitoring of contractions #Pain: Well-controlled with epidural #FWB: Cat II for minimal variability, will try position changes  #GBS positive #Hep C- postpartum GI consult  [name], MD 9:13 AM

## 2020-10-23 NOTE — Progress Notes (Signed)
Labor Progress Note Abigail Pittman is a 25 y.o. G2P0010 at [redacted]w[redacted]d presented for IOL for post dates. S:   Patient denies pain or feeling contractions  O:  BP 122/79   Pulse 62   Temp 98.1 F (36.7 C) (Oral)   Resp 18   Ht 5' 5.5" (1.664 m)   Wt 91.2 kg   LMP 01/09/2020 (Exact Date)   BMI 32.94 kg/m  EFM: 140/Moderate/ +Accels, -Deccels  CVE: Dilation: 4 Effacement (%): 80 Cervical Position: Posterior Station: -1 Presentation: Vertex Exam by:: Hazle Quant, RN   A&P: 25 y.o. G2P0010 [redacted]w[redacted]d presented for IOL for post dates. #Labor: Progressing well.  Contractions able to be traced easier on patient's back. Contractions occurring every 1 minute.  Continue Pitocin #Pain: Epidural in place. #FWB: Cat 1 #GBS positive, on Cefazolin #Hep C:  No FSE during labor, will need GI/ID work-up post partum  Jovita Kussmaul, MD 7:08 AM

## 2020-10-23 NOTE — Progress Notes (Signed)
Labor Progress Note Abigail Pittman is a 25 y.o. G2P0010 at [redacted]w[redacted]d presented for IOL for post dates S: Patient feeling contractions but was able to get some rest.    O:  BP 124/78   Pulse 70   Temp 97.8 F (36.6 C) (Axillary)   Resp 16   Ht 5' 5.5" (1.664 m)   Wt 91.2 kg   LMP 01/09/2020 (Exact Date)   BMI 32.94 kg/m  EFM: 150/Moderate/ + Accels, + Few variable deccels  CVE: Dilation: 3 Effacement (%): 50 Cervical Position: Posterior Station: -2 Presentation: Vertex Exam by:: Lovenia Shuck, RN   A&P: 25 y.o. G2P0010 [redacted]w[redacted]d presented for IOL for post dates #Labor: No significant change with 2nd dose of Cytotec. #Pain: Plan to place Epidural #FWB:  Cat 2 #GBS positive, on Cefazolin. #Hep C:  No FSE during labor, will need GI/ID work-up post partum  Jovita Kussmaul, MD 1:49 AM

## 2020-10-23 NOTE — Discharge Summary (Addendum)
Postpartum Discharge Summary      Patient Name: Abigail Pittman DOB: [birth year] MRN: [id number]  Date of admission: 10/22/2020 Delivery date:10/23/2020  Delivering provider: Serita Grammes D  Date of discharge: 10/24/2020  Admitting diagnosis: Post term pregnancy at [redacted] weeks gestation [O48.0, Z3A.41] Intrauterine pregnancy: [redacted]w[redacted]d    Secondary diagnosis:  Active Problems:   History of hepatitis C   Marginal insertion of umbilical cord affecting management of mother   Rh negative state in antepartum period   GBS carrier   Post term pregnancy at 428weeks gestation  Additional problems: none    Discharge diagnosis: Term Pregnancy Delivered                                              Post partum procedures:rhogam and postplacental IUD   Augmentation: AROM, Pitocin, Cytotec, and IP Foley Complications: None  Hospital course: Induction of Labor With Vaginal Delivery   25y.o. yo G2P0010 at 416w1das admitted to the hospital 10/22/2020 for induction of labor.  Indication for induction: Postdates.  Patient had an uncomplicated labor course including the usual cervical ripening followed by Pit/AROM. She had a postplacental IUD placement which she tolerated well (see Procedure note). Membrane Rupture Time/Date: 9:00 AM ,10/23/2020   Delivery Method:Vaginal, Spontaneous  Episiotomy: None  Lacerations:  None  Details of delivery can be found in separate delivery note.  Patient had a routine postpartum course. Patient is discharged home 10/24/20.  Newborn Data: Birth date:10/23/2020  Birth time:3:31 PM  Gender:Female  Living status:Living  Apgars:8 ,9  Weight:2886 g   Magnesium Sulfate received: No BMZ received: No Rhophylac:Yes MMR:N/A T-DaP:Given prenatally Flu: Yes Transfusion:No  Physical exam  Vitals:   10/23/20 1901 10/23/20 2321 10/24/20 0303 10/24/20 0634  BP: 116/77 117/60 114/78 116/75  Pulse: 89 76 73 71  Resp: _0 Temp: 98.9 F (37.2 C) 98.2 F (36.8  C) 97.9 F (36.6 C) 98 F (36.7 C)  TempSrc: Oral Oral Oral Oral  SpO2: 100% 98% 99% 100%  Weight:      Height:       General: alert, cooperative, and no distress Lochia: appropriate Uterine Fundus: firm Incision: N/A DVT Evaluation: No evidence of DVT seen on physical exam. Labs: Lab Results  Component Value Date   WBC 13.9 (H) 10/24/2020   HGB 11.1 (L) 10/24/2020   HCT 31.7 (L) 10/24/2020   MCV 93.0 10/24/2020   PLT 224 10/24/2020   CMP Latest Ref Rng & Units 08/22/2020  Glucose 65 - 99 mg/dL 70  BUN 6 - 20 mg/dL 10  Creatinine 0.57 - 1.00 mg/dL 0.59  Sodium 134 - 144 mmol/L 138  Potassium 3.5 - 5.2 mmol/L 4.4  Chloride 96 - 106 mmol/L 106  CO2 20 - 29 mmol/L 17(L)  Calcium 8.7 - 10.2 mg/dL 8.6(L)  Total Protein 6.0 - 8.5 g/dL 6.1  Total Bilirubin 0.0 - 1.2 mg/dL 0.4  Alkaline Phos 44 - 121 IU/L 108  AST 0 - 40 IU/L 20  ALT 0 - 32 IU/L 19   Edinburgh Score: Edinburgh Postnatal Depression Scale Screening Tool 10/23/2020  I have been able to laugh and see the funny side of things. 0  I have looked forward with enjoyment to things. 0  I have blamed myself unnecessarily when things went wrong. 0  I have been anxious or  worried for no good reason. 0  I have felt scared or panicky for no good reason. 0  Things have been getting on top of me. 0  I have been so unhappy that I have had difficulty sleeping. 0  I have felt sad or miserable. 0  I have been so unhappy that I have been crying. 0  The thought of harming myself has occurred to me. 0  Edinburgh Postnatal Depression Scale Total 0     After visit meds:  Allergies as of 10/24/2020       Reactions   Penicillins Hives   07/2020: took keflex okay?        Medication List     STOP taking these medications    clotrimazole 1 % cream Commonly known as: LOTRIMIN   PRENATAL VITAMINS PO       TAKE these medications    acetaminophen 325 MG tablet Commonly known as: Tylenol Take 2 tablets (650 mg total)  by mouth every 4 (four) hours as needed (for pain scale < 4). What changed:  when to take this reasons to take this   coconut oil Oil Apply 1 application topically as needed.   docusate sodium 100 MG capsule Commonly known as: Colace Take 1 capsule (100 mg total) by mouth 2 (two) times daily.   ibuprofen 600 MG tablet Commonly known as: ADVIL Take 1 tablet (600 mg total) by mouth every 6 (six) hours.   simethicone 80 MG chewable tablet Commonly known as: MYLICON Chew 1 tablet (80 mg total) by mouth as needed for flatulence.         Discharge home in stable condition Infant Feeding: Bottle and Breast Infant Disposition:home with mother Discharge instruction: per After Visit Summary and Postpartum booklet. Activity: Advance as tolerated. Pelvic rest for 6 weeks.  Diet: routine diet Future Appointments: Future Appointments  Date Time Provider [place]  11/27/2020  2:15 PM Virginia Rochester, NP [facility]   Follow up Visit:  Myrtis Ser, CNM  P [facility] Admin Pool Please schedule this patient for Postpartum visit in: 4 weeks with the following provider: Any provider  In-Person  For C/S patients schedule nurse incision check in weeks 2 weeks: no  Low risk pregnancy complicated by: +Hep C  Delivery mode:  SVD  Anticipated Birth Control:  PP IUD Placed  PP Procedures needed: f/u GI for Hep C  Schedule Integrated [place] visit: no    10/24/2020 Delora Fuel, MD  I spoke with and examined patient and agree with resident/PA-S/MS/SNM's note and plan of care.  Roma Schanz, CNM, [facility] 10/24/2020 7:49 AM

## 2020-10-23 NOTE — Anesthesia Preprocedure Evaluation (Signed)
Anesthesia Evaluation  Patient identified by MRN, date of birth, ID band Patient awake    Reviewed: Allergy & Precautions, H&P , NPO status , Patient's Chart, lab work & pertinent test results, reviewed documented beta blocker date and time   Airway Mallampati: II  TM Distance: >3 FB Neck ROM: full    Dental no notable dental hx. (+) Teeth Intact   Pulmonary neg pulmonary ROS, former smoker,    Pulmonary exam normal breath sounds clear to auscultation       Cardiovascular negative cardio ROS Normal cardiovascular exam Rhythm:regular Rate:Normal     Neuro/Psych negative neurological ROS  negative psych ROS   GI/Hepatic negative GI ROS, (+)     substance abuse  , Hepatitis -, C  Endo/Other  negative endocrine ROS  Renal/GU negative Renal ROS  negative genitourinary   Musculoskeletal   Abdominal   Peds  Hematology negative hematology ROS (+)   Anesthesia Other Findings   Reproductive/Obstetrics (+) Pregnancy                             Anesthesia Physical Anesthesia Plan  ASA: 3  Anesthesia Plan: Epidural   Post-op Pain Management:    Induction:   PONV Risk Score and Plan:   Airway Management Planned: Natural Airway  Additional Equipment: None  Intra-op Plan:   Post-operative Plan:   Informed Consent: I have reviewed the patients History and Physical, chart, labs and discussed the procedure including the risks, benefits and alternatives for the proposed anesthesia with the patient or authorized representative who has indicated his/her understanding and acceptance.     Dental Advisory Given  Plan Discussed with: Anesthesiologist  Anesthesia Plan Comments: (Labs checked- platelets confirmed with RN in room. Fetal heart tracing, per RN, reported to be stable enough for sitting procedure. Discussed epidural, and patient consents to the procedure:  included risk of possible  headache,backache, failed block, allergic reaction, and nerve injury. This patient was asked if she had any questions or concerns before the procedure started.)        Anesthesia Quick Evaluation

## 2020-10-23 NOTE — Lactation Note (Signed)
This note was copied from a baby's chart. Lactation Consultation Note  Patient Name: Abigail Pittman [id number] Date: 10/23/2020 Reason for consult: L&D Initial assessment Age:24 hours  P1, Baby latched with ease in cradle hold. Intermittent swallows observed. Lactation will follow up on MBU.  Maternal Data  HX Hep C Does the patient have breastfeeding experience prior to this delivery?: No  Feeding Mother's Current Feeding Choice: Breast Milk  LATCH Score Latch: Grasps breast easily, tongue down, lips flanged, rhythmical sucking.  Audible Swallowing: A few with stimulation  Type of Nipple: Everted at rest and after stimulation  Comfort (Breast/Nipple): Soft / non-tender  Hold (Positioning): Assistance needed to correctly position infant at breast and maintain latch.  LATCH Score: 8   Interventions Interventions: Breast feeding basics reviewed;Assisted with latch;Skin to skin;Education  Consult Status Consult Status: Follow-up from L&D    Dahlia Byes [facility] 10/23/2020, 5:01 PM

## 2020-10-23 NOTE — Procedures (Signed)
POST-PLACENTAL IUD INSERTION PROCEDURE NOTE Patient name: Abigail Pittman MRN [id number]  Date of birth: [birth year]  The risks and benefits of the method and placement have been thouroughly reviewed with the patient and all questions were answered.  Specifically the patient is aware of failure rate of 03/998, expulsion of the IUD and of possible perforation.  The patient is aware of irregular bleeding due to the method and understands the incidence of irregular bleeding diminishes with time.   Signed copy of informed consent in chart.   Vaginal, labial and perineal areas thoroughly inspected for lacerations. Lacerations: sm skin separation at introitus identified and if needed was repaired prior to IUD insertion (no repair).  Pt's IUD choice: Liletta  Time out was performed.    IUD removed from insertion device and grasped between sterile gloved fingers. Fundus identified through abdominal wall using non-insertion hand. IUD inserted to fundus with bimanual technique. IUD carefully released at the fundus and insertion hand gently removed from vagina.    Strings trimmed to the level of the introitus. Patient tolerated procedure well.  Patient given post procedure instructions and IUD care card with expiration date.  Patient is asked to keep IUD strings tucked in her vagina until her postpartum follow up visit in 4-6 weeks. Patient advised to abstain from sexual intercourse and pulling on strings before her follow-up visit. Patient verbalized an understanding of the plan of care and agrees.   Pt added to the post-placental IUD insertion list and charges entered. Lot: [id number] Exp: 12/2023   Arabella Merles CNM 10/23/2020 4:24 PM

## 2020-10-23 NOTE — Progress Notes (Signed)
Labor Progress Note Colbi Schiltz is a 25 y.o. G2P0010 at [redacted]w[redacted]d presented for PD IOL S: Has gotten some rest with her epidural, not feeling her contractions very strongly   O:  BP 111/62   Pulse 67   Temp 97.8 F (36.6 C) (Oral)   Resp 18   Ht 5' 5.5" (1.664 m)   Wt 91.2 kg   LMP 01/09/2020 (Exact Date)   BMI 32.94 kg/m  EFM: 150/moderate/no accels or decels  CVE: Dilation: Lip/rim Effacement (%): 100 Cervical Position: Posterior Station: 0, Plus 1 Presentation: Vertex Exam by:: Sandy Salaam, RN   A&P: 25 y.o. G2P0010 [redacted]w[redacted]d here for PD IOL #Labor: Progressing well. Baby has dropped to +1 station with a lip remaining, will try position changes to encourage further pelvic engagement #Pain: Epidural in place #FWB: Cat I  #GBS positive> Ancef running #Hep C- GI consult postpartum   [name], MD 2:11 PM

## 2020-10-24 LAB — CBC
HCT: 31.7 % — ABNORMAL LOW (ref 36.0–46.0)
Hemoglobin: 11.1 g/dL — ABNORMAL LOW (ref 12.0–15.0)
MCH: 32.6 pg (ref 26.0–34.0)
MCHC: 35 g/dL (ref 30.0–36.0)
MCV: 93 fL (ref 80.0–100.0)
Platelets: 224 10*3/uL (ref 150–400)
RBC: 3.41 MIL/uL — ABNORMAL LOW (ref 3.87–5.11)
RDW: 13.5 % (ref 11.5–15.5)
WBC: 13.9 10*3/uL — ABNORMAL HIGH (ref 4.0–10.5)
nRBC: 0 % (ref 0.0–0.2)

## 2020-10-24 MED ORDER — ACETAMINOPHEN 325 MG PO TABS: 650.0000 mg | ORAL_TABLET | ORAL | Status: DC | PRN

## 2020-10-24 MED ORDER — SIMETHICONE 80 MG PO CHEW: 80.0000 mg | CHEWABLE_TABLET | ORAL | 0 refills | Status: DC | PRN

## 2020-10-24 MED ORDER — IBUPROFEN 600 MG PO TABS: 600.0000 mg | ORAL_TABLET | Freq: Four times a day (QID) | ORAL | 0 refills | Status: DC

## 2020-10-24 MED ORDER — COCONUT OIL OIL: 1.0000 "application " | TOPICAL_OIL | 0 refills | Status: DC | PRN

## 2020-10-24 NOTE — Anesthesia Postprocedure Evaluation (Signed)
Anesthesia Post Note  Patient: Creasie Lacosse  Procedure(s) Performed: AN AD HOC LABOR EPIDURAL     Patient location during evaluation: Mother Baby Anesthesia Type: Epidural Level of consciousness: awake and alert Pain management: pain level controlled Vital Signs Assessment: post-procedure vital signs reviewed and stable Respiratory status: spontaneous breathing Cardiovascular status: stable Postop Assessment: no headache, adequate PO intake, no backache, able to ambulate, epidural receding and no apparent nausea or vomiting Anesthetic complications: no   No notable events documented.  Last Vitals:  Vitals:   10/24/20 0303 10/24/20 0634  BP: 114/78 116/75  Pulse: 73 71  Resp: 16 16  Temp: 36.6 C 36.7 C  SpO2: 99% 100%    Last Pain:  Vitals:   10/24/20 0920  TempSrc:   PainSc: 0-No pain   Pain Goal:                   Salome Arnt

## 2020-10-24 NOTE — Lactation Note (Addendum)
This note was copied from a baby's chart. Lactation Consultation Note  Patient Name: Abigail Pittman Date: 10/24/2020 Reason for consult: Initial assessment Age:24 hours  Infant latched for 15 mins with good depth. Taught hand expression.  Infant was fed 5-6 drops of colostrum with a spoon  Infant returned to the alternate breast for 10 mins.  Mother plans to offer [facility] after this feeding.  Mother has DEBP at the bedside. Suggested that she pump after feedings  every 3 hours for 15 mins.  Mother to continue to supplement with EBM/DBM according to supplemental guidelines. Discussed cue base feeding, STS and cluster feeding LC brochure given with basic teaching done.   Maternal Data Has patient been taught Hand Expression?: Yes Does the patient have breastfeeding experience prior to this delivery?: No  Feeding Mother's Current Feeding Choice: Breast Milk  LATCH Score Latch: Grasps breast easily, tongue down, lips flanged, rhythmical sucking.  Audible Swallowing: Spontaneous and intermittent  Type of Nipple: Everted at rest and after stimulation  Comfort (Breast/Nipple): Soft / non-tender  Hold (Positioning): Assistance needed to correctly position infant at breast and maintain latch.  LATCH Score: 9   Lactation Tools Discussed/Used    Interventions Interventions: Breast feeding basics reviewed;Assisted with latch;Skin to skin;Hand express;Breast compression;Adjust position;Support pillows;Position options;DEBP;Hand pump  Discharge Pump: DEBP  Consult Status Consult Status: Follow-up Date: 10/24/20 Follow-up type: In-patient    Stevan Born [facility] 10/24/2020, 9:21 AM

## 2020-10-24 NOTE — Social Work (Signed)
CSW received consult for hx substance use.  Referral was screened out due to the following:  ~MOB had no documented substance use after initial prenatal visit/+UPT. ~MOB had no positive drug screens after initial prenatal visit/+UPT.  Per chart review, MOB has not used substances since June 2021. Additionally, MOB has completed Narcotics Anonymous.   CSW identifies no further need for intervention and no barriers to discharge at this time.  Manfred Arch, LCSWA Clinical Social Work [employer] and [employer] [phone]

## 2020-10-25 NOTE — Discharge Summary (Addendum)
Postpartum Discharge Summary      Patient Name: Abigail Pittman DOB: October 23, 1995 MRN: [id number]  Date of admission: 10/22/2020 Delivery date:10/23/2020  Delivering provider: Serita Grammes D  Date of discharge: 10/25/2020  Admitting diagnosis: Post term pregnancy at [redacted] weeks gestation [O48.0, Z3A.41] Intrauterine pregnancy: [redacted]w[redacted]d    Secondary diagnosis:  Active Problems:   History of hepatitis C   Marginal insertion of umbilical cord affecting management of mother   Rh negative state in antepartum period   GBS carrier   Post term pregnancy at 439weeks gestation SVD  Additional problems: none    Discharge diagnosis: Term Pregnancy Delivered                                              Post partum procedures:rhogam and postplacental IUD   Augmentation: AROM, Pitocin, Cytotec, and IP Foley Complications: None  Hospital course: Induction of Labor With Vaginal Delivery   25y.o. yo G2P0010 at 469w1das admitted to the hospital 10/22/2020 for induction of labor.  Indication for induction: Postdates.  Patient had an uncomplicated labor course including the usual cervical ripening followed by Pit/AROM. She had a postplacental IUD placement which she tolerated well (see Procedure note). Membrane Rupture Time/Date: 9:00 AM ,10/23/2020   Delivery Method:Vaginal, Spontaneous  Episiotomy: None  Lacerations:  None  Details of delivery can be found in separate delivery note.  Patient had a routine postpartum course. Patient is discharged home 10/25/20.  Newborn Data: Birth date:10/23/2020  Birth time:3:31 PM  Gender:Female  Living status:Living  Apgars:8 ,9  Weight:2886 g   Magnesium Sulfate received: No BMZ received: No Rhophylac:Yes MMR:N/A T-DaP:Given prenatally Flu: Yes Transfusion:No  Physical exam  Vitals:   10/24/20 0634 10/24/20 1320 10/24/20 2142 10/25/20 0511  BP: 116/75 (!) 132/59 126/69 127/70  Pulse: 71 73 71 72  Resp: _0 Temp: 98 F (36.7 C) 98 F  (36.7 C) 97.7 F (36.5 C) 97.7 F (36.5 C)  TempSrc: Oral Oral Oral Oral  SpO2: 100%  100% 99%  Weight:      Height:       General: Alert, cooperative, well-appearing Lochia: Appropriate Uterine Fundus: Firm DVT Evaluation: No LE Edema, erythema, tenderness. Up and walking. Homan's sign neg. Labs: Lab Results  Component Value Date   WBC 13.9 (H) 10/24/2020   HGB 11.1 (L) 10/24/2020   HCT 31.7 (L) 10/24/2020   MCV 93.0 10/24/2020   PLT 224 10/24/2020   CMP Latest Ref Rng & Units 08/22/2020  Glucose 65 - 99 mg/dL 70  BUN 6 - 20 mg/dL 10  Creatinine 0.57 - 1.00 mg/dL 0.59  Sodium 134 - 144 mmol/L 138  Potassium 3.5 - 5.2 mmol/L 4.4  Chloride 96 - 106 mmol/L 106  CO2 20 - 29 mmol/L 17(L)  Calcium 8.7 - 10.2 mg/dL 8.6(L)  Total Protein 6.0 - 8.5 g/dL 6.1  Total Bilirubin 0.0 - 1.2 mg/dL 0.4  Alkaline Phos 44 - 121 IU/L 108  AST 0 - 40 IU/L 20  ALT 0 - 32 IU/L 19   Edinburgh Score: Edinburgh Postnatal Depression Scale Screening Tool 10/23/2020  I have been able to laugh and see the funny side of things. 0  I have looked forward with enjoyment to things. 0  I have blamed myself unnecessarily when things went wrong. 0  I have been anxious  or worried for no good reason. 0  I have felt scared or panicky for no good reason. 0  Things have been getting on top of me. 0  I have been so unhappy that I have had difficulty sleeping. 0  I have felt sad or miserable. 0  I have been so unhappy that I have been crying. 0  The thought of harming myself has occurred to me. 0  Edinburgh Postnatal Depression Scale Total 0     After visit meds:  Allergies as of 10/25/2020       Reactions   Penicillins Hives   07/2020: took keflex okay?        Medication List     STOP taking these medications    clotrimazole 1 % cream Commonly known as: LOTRIMIN   PRENATAL VITAMINS PO       TAKE these medications    acetaminophen 325 MG tablet Commonly known as: Tylenol Take 2 tablets  (650 mg total) by mouth every 4 (four) hours as needed (for pain scale < 4). What changed:  when to take this reasons to take this   coconut oil Oil Apply 1 application topically as needed.   docusate sodium 100 MG capsule Commonly known as: Colace Take 1 capsule (100 mg total) by mouth 2 (two) times daily.   ibuprofen 600 MG tablet Commonly known as: ADVIL Take 1 tablet (600 mg total) by mouth every 6 (six) hours.   simethicone 80 MG chewable tablet Commonly known as: MYLICON Chew 1 tablet (80 mg total) by mouth as needed for flatulence.         Discharge home in stable condition Infant Feeding: Bottle and Breast Infant Disposition:home with mother Discharge instruction: per After Visit Summary and Postpartum booklet. Activity: Advance as tolerated. Pelvic rest for 6 weeks.  Diet: routine diet Future Appointments: Referral to GI for Hep C Future Appointments  Date Time Provider [place]  11/27/2020  2:15 PM Virginia Rochester, NP [facility]   Follow up Visit:  Myrtis Ser, CNM  P [facility] Admin Pool Please schedule this patient for Postpartum visit in: 4 weeks with the following provider: Any provider  In-Person  For C/S patients schedule nurse incision check in weeks 2 weeks: no  Low risk pregnancy complicated by: +Hep C  Delivery mode:  SVD  Anticipated Birth Control:  PP IUD Placed  PP Procedures needed: f/u GI for Hep C  Schedule Integrated [place] visit: no    10/25/2020 [name], MD  Midwife attestation I have seen and examined this patient and agree with above documentation in the resident's note.   [name] is a 25 y.o. G2P1011 s/p SVD.  Pain is well controlled. Plan for birth control is IUD. Method of Feeding: breast  PE:  Gen: well appearing Heart: reg rate Lungs: normal WOB Fundus firm Ext: no pain, no edema  Recent Labs    10/22/20 1719 10/24/20 0529  HGB 12.8 11.1*  HCT 37.9 31.7*   Assessment S/p SVD PPD  # 2  Plan: - discharge home - postpartum care discussed - f/u in office in 6 weeks for postpartum visit- string check/trim - GI referrel for Hep C  Julianne Handler, CNM 10:55 AM

## 2020-11-02 ENCOUNTER — Telehealth (HOSPITAL_COMMUNITY): Payer: Self-pay | Admitting: *Deleted

## 2020-11-02 NOTE — Telephone Encounter (Signed)
Mom reports feeling good. No concerns about herself. EPDS=3 [facility] score =0) Mom reports baby is well. Feeding formula with a little constipation, had peds appt today. Gaining weight. Sleeps in bassinet in parents room on back. No concerns about baby.  Duffy Rhody, RN 11-02-2020 at 2:00pm

## 2020-11-22 ENCOUNTER — Encounter: Payer: Self-pay | Admitting: General Practice

## 2020-11-27 ENCOUNTER — Encounter: Payer: Self-pay | Admitting: Nurse Practitioner

## 2020-11-27 ENCOUNTER — Ambulatory Visit (INDEPENDENT_AMBULATORY_CARE_PROVIDER_SITE_OTHER): Payer: Medicaid Other | Admitting: Nurse Practitioner

## 2020-11-27 ENCOUNTER — Other Ambulatory Visit: Payer: Self-pay

## 2020-11-27 VITALS — BP 107/65 | HR 82 | Wt 184.0 lb

## 2020-11-27 DIAGNOSIS — Z975 Presence of (intrauterine) contraceptive device: Secondary | ICD-10-CM

## 2020-11-27 DIAGNOSIS — Z8619 Personal history of other infectious and parasitic diseases: Secondary | ICD-10-CM

## 2020-11-27 NOTE — Progress Notes (Signed)
Post Partum Visit Note  Abigail Pittman is a 25 y.o. G70P1011 female who presents for a postpartum visit. She is 5 weeks postpartum following a normal spontaneous vaginal delivery.  I have fully reviewed the prenatal and intrapartum course. The delivery was at [redacted]W[redacted]D gestational weeks.  Anesthesia: epidural. Postpartum course has been good. Baby is doing well. Baby is feeding by bottle - Similac Sensitive RS. Bleeding staining only. Bowel function is normal. Bladder function is normal. Patient is not sexually active. Contraception method is IUD. Postpartum depression screening: negative.   The pregnancy intention screening data noted above was reviewed. Potential methods of contraception were discussed. The patient elected to proceed with No data recorded.   Edinburgh Postnatal Depression Scale - 11/27/20 1426       Edinburgh Postnatal Depression Scale:  In the Past 7 Days   I have been able to laugh and see the funny side of things. 0    I have looked forward with enjoyment to things. 0    I have blamed myself unnecessarily when things went wrong. 0    I have been anxious or worried for no good reason. 0    I have felt scared or panicky for no good reason. 0    Things have been getting on top of me. 0    I have been so unhappy that I have had difficulty sleeping. 0    I have felt sad or miserable. 0    I have been so unhappy that I have been crying. 0    The thought of harming myself has occurred to me. 0    Edinburgh Postnatal Depression Scale Total 0             Health Maintenance Due  Topic Date Due   COVID-19 Vaccine (1) Never done   HPV VACCINES (1 - 2-dose series) Never done   INFLUENZA VACCINE  Never done    The following portions of the patient's history were reviewed and updated as appropriate: allergies, current medications, past family history, past medical history, past social history, past surgical history, and problem list.  Review of Systems Pertinent  items noted in HPI and remainder of comprehensive ROS otherwise negative.  Objective:  BP 107/65   Pulse 82   Wt 184 lb (83.5 kg)   LMP 01/09/2020 (Exact Date)   Breastfeeding No   BMI 30.15 kg/m    General:  alert, cooperative, and no distress   Breasts:  not indicated  Lungs: clear to auscultation bilaterally  Heart:  regular rate and rhythm, S1, S2 normal, no murmur, click, rub or gallop  Abdomen: soft, non-tender; bowel sounds normal; no masses,  no organomegaly   Wound none  GU exam:   Speculum exam done  IUD strings visualized and trimmed to 2-3 cm in length.  No part of IUD seen or palpated on bimanual exam.  Patient had some pain during the speculum exam but tolerated very well.  No lacerations seen, no other problems identified during speculum exam.       Assessment:     Normal postpartum exam.  History of Hep C  Plan:   Essential components of care per ACOG recommendations:  1.  Mood and well being: Patient with negative depression screening today. Reviewed local resources for support.  - Patient tobacco use? No.   - hx of drug use? No.    2. Infant care and feeding:  -Patient currently breastmilk feeding? No.  -Social determinants  of health (SDOH) reviewed in EPIC. No concerns  3. Sexuality, contraception and birth spacing - Patient does not want a pregnancy in the next year.  - Reviewed forms of contraception in tiered fashion. Patient desired IUD today.   - Discussed birth spacing of 18 months  4. Sleep and fatigue -Encouraged family/partner/community support of 4 hrs of uninterrupted sleep to help with mood and fatigue  5. Physical Recovery  - Discussed patients delivery and complications. She describes her labor as good. - Patient had a Vaginal, no problems at delivery. Patient had a  no   laceration. Perineal healing reviewed. Patient expressed understanding - Patient has urinary incontinence? No. - Patient is safe to resume physical and sexual  activity  6.  Health Maintenance - HM due items addressed Yes - Last pap smear  Diagnosis  Date Value Ref Range Status  04/02/2020      - Negative for intraepithelial lesion or malignancy (NILM)   Pap smear not done at today's visit.  -Breast Cancer screening indicated? No.   7. Chronic Disease/Pregnancy Condition follow up:  GI for Hep C management  - referral sent  - PCP follow up  Currie Paris, NP [facility] [employer], [facility]

## 2020-12-31 ENCOUNTER — Encounter: Payer: Self-pay | Admitting: Emergency Medicine

## 2020-12-31 ENCOUNTER — Ambulatory Visit
Admission: EM | Admit: 2020-12-31 | Discharge: 2020-12-31 | Disposition: A | Discharge status: Home / Self Care | Payer: Medicaid Other | Attending: Internal Medicine | Admitting: Internal Medicine

## 2020-12-31 ENCOUNTER — Other Ambulatory Visit: Payer: Self-pay

## 2020-12-31 DIAGNOSIS — J029 Acute pharyngitis, unspecified: Secondary | ICD-10-CM | POA: Insufficient documentation

## 2020-12-31 DIAGNOSIS — B351 Tinea unguium: Secondary | ICD-10-CM | POA: Diagnosis not present

## 2020-12-31 DIAGNOSIS — J069 Acute upper respiratory infection, unspecified: Secondary | ICD-10-CM | POA: Diagnosis not present

## 2020-12-31 LAB — POCT INFLUENZA A/B
Influenza A, POC: NEGATIVE
Influenza B, POC: NEGATIVE

## 2020-12-31 LAB — POCT RAPID STREP A (OFFICE): Rapid Strep A Screen: NEGATIVE

## 2020-12-31 MED ORDER — BENZONATATE 100 MG PO CAPS: 100.0000 mg | ORAL_CAPSULE | Freq: Three times a day (TID) | ORAL | 0 refills | Status: DC | PRN

## 2020-12-31 MED ORDER — CICLOPIROX 8 % EX SOLN: Freq: Every day | CUTANEOUS | 0 refills | Status: DC

## 2020-12-31 MED ORDER — FLUTICASONE PROPIONATE 50 MCG/ACT NA SUSP: 1.0000 | Freq: Every day | NASAL | 0 refills | Status: DC

## 2020-12-31 MED ORDER — CETIRIZINE HCL 5 MG PO TABS: 5.0000 mg | ORAL_TABLET | Freq: Every day | ORAL | 0 refills | Status: DC

## 2020-12-31 NOTE — ED Provider Notes (Signed)
[facility]    CSN: [id number] Arrival date & time: 12/31/20  1529      History   Chief Complaint Chief Complaint  Patient presents with   Sore Throat   Cough    HPI Aniyia Rane is a 25 y.o. female.   Patient presents with sore throat, nasal congestion, and cough that has been present for 4 days.  Denies any chest pain or shortness of breath.  Does have painful swallowing but denies difficulty swallowing.  Denies any fevers or any known sick contacts.  Denies nausea, vomiting, diarrhea, abdominal pain.  Patient has taken over-the-counter cough and cold medications with minimal improvement in symptoms.  Patient also has discoloration of left great toenail that she is concerned for toenail fungus that has been present for a few weeks.  Denies any pain to the foot.   Sore Throat  Cough  Past Medical History:  Diagnosis Date   COVID-19 03/2020   Hepatitis C    History of drug use    Threatened miscarriage 02/17/2020   UTI (urinary tract infection)     Patient Active Problem List   Diagnosis Date Noted   Presence of IUD 11/27/2020   History of dental abscess 07/25/2020   History of drug use 03/27/2020   History of hepatitis C 03/27/2020    Past Surgical History:  Procedure Laterality Date   SHOULDER SURGERY Left     OB History     Gravida  2   Para  1   Term  1   Preterm  0   AB  1   Living  1      SAB  1   IAB  0   Ectopic  0   Multiple  0   Live Births  1            Home Medications    Prior to Admission medications   Medication Sig Start Date End Date Taking? Authorizing Provider  benzonatate (TESSALON) 100 MG capsule Take 1 capsule (100 mg total) by mouth every 8 (eight) hours as needed for cough. 12/31/20  Yes Judas Mohammad, Acie Fredrickson, FNP  cetirizine (ZYRTEC) 5 MG tablet Take 1 tablet (5 mg total) by mouth daily for 10 days. 12/31/20 01/10/21 Yes Kymia Simi, Acie Fredrickson, FNP  ciclopirox (PENLAC) 8 % solution Apply topically  at bedtime. Apply over nail and surrounding skin. Apply daily over previous coat. After seven (7) days, may remove with alcohol and continue cycle. 12/31/20  Yes Irmalee Riemenschneider, Rolly Salter E, FNP  fluticasone (FLONASE) 50 MCG/ACT nasal spray Place 1 spray into both nostrils daily for 3 days. 12/31/20 01/03/21 Yes Claudie Brickhouse, Acie Fredrickson, FNP  acetaminophen (TYLENOL) 325 MG tablet Take 2 tablets (650 mg total) by mouth every 4 (four) hours as needed (for pain scale < 4). 10/24/20   Maness, Loistine Chance, MD  coconut oil OIL Apply 1 application topically as needed. Patient not taking: Reported on 11/27/2020 10/24/20   Jovita Kussmaul, MD  docusate sodium (COLACE) 100 MG capsule Take 1 capsule (100 mg total) by mouth 2 (two) times daily. 08/22/20   Harrison Bing, MD  ibuprofen (ADVIL) 600 MG tablet Take 1 tablet (600 mg total) by mouth every 6 (six) hours. 10/24/20   Maness, Loistine Chance, MD  simethicone (MYLICON) 80 MG chewable tablet Chew 1 tablet (80 mg total) by mouth as needed for flatulence. Patient not taking: Reported on 11/27/2020 10/24/20   Jovita Kussmaul, MD    Family History Family History  Problem  Relation Age of Onset   Diverticulitis Mother    Pancreatic cancer Father    Skin cancer Father    Colon cancer Father     Social History Social History   Tobacco Use   Smoking status: Former    Packs/day: 1.00    Types: Cigarettes    Quit date: 02/09/2020    Years since quitting: 0.8   Smokeless tobacco: Never  Vaping Use   Vaping Use: Former   Quit date: 08/26/2019   Substances: Nicotine-salt  Substance Use Topics   Alcohol use: Not Currently   Drug use: Not Currently    Types: Marijuana, Methamphetamines, Heroin    Comment: 03/27/20 6 months free     Allergies   Penicillins   Review of Systems Review of Systems Per HPI  Physical Exam Triage Vital Signs ED Triage Vitals [12/31/20 1628]  Enc Vitals Group     BP 98/66     Pulse Rate 71     Resp 16     Temp 98 F (36.7 C)     Temp Source Oral      SpO2 99 %     Weight      Height      Head Circumference      Peak Flow      Pain Score 3     Pain Loc      Pain Edu?      Excl. in GC?    No data found.  Updated Vital Signs BP 98/66 (BP Location: Left Arm)   Pulse 71   Temp 98 F (36.7 C) (Oral)   Resp 16   SpO2 99%   Breastfeeding No   Visual Acuity Right Eye Distance:   Left Eye Distance:   Bilateral Distance:    Right Eye Near:   Left Eye Near:    Bilateral Near:     Physical Exam Constitutional:      General: She is not in acute distress.    Appearance: Normal appearance. She is not toxic-appearing or diaphoretic.  HENT:     Head: Normocephalic and atraumatic.     Right Ear: Ear canal normal. A middle ear effusion is present. Tympanic membrane is not perforated, erythematous or bulging.     Left Ear: Ear canal normal. A middle ear effusion is present. Tympanic membrane is not perforated, erythematous or bulging.     Nose: Congestion present.     Mouth/Throat:     Mouth: Mucous membranes are moist.     Pharynx: Posterior oropharyngeal erythema present.  Eyes:     Extraocular Movements: Extraocular movements intact.     Conjunctiva/sclera: Conjunctivae normal.     Pupils: Pupils are equal, round, and reactive to light.  Cardiovascular:     Rate and Rhythm: Normal rate and regular rhythm.     Pulses: Normal pulses.     Heart sounds: Normal heart sounds.  Pulmonary:     Effort: Pulmonary effort is normal. No respiratory distress.     Breath sounds: Normal breath sounds. No wheezing.  Abdominal:     General: Abdomen is flat. Bowel sounds are normal.     Palpations: Abdomen is soft.  Musculoskeletal:        General: Normal range of motion.     Cervical back: Normal range of motion.  Skin:    General: Skin is warm and dry.     Comments: Yellow discoloration to distal end of left great toenail.  Neurovascular intact.  Neurological:  General: No focal deficit present.     Mental Status: She is alert and  oriented to person, place, and time. Mental status is at baseline.  Psychiatric:        Mood and Affect: Mood normal.        Behavior: Behavior normal.     UC Treatments / Results  Labs (all labs ordered are listed, but only abnormal results are displayed) Labs Reviewed  NOVEL CORONAVIRUS, NAA  CULTURE, GROUP A STREP ([facility])  COVID-19, FLU A+B AND RSV  POCT RAPID STREP A (OFFICE)  POCT INFLUENZA A/B    EKG   Radiology No results found.  Procedures Procedures (including critical care time)  Medications Ordered in UC Medications - No data to display  Initial Impression / Assessment and Plan / UC Course  I have reviewed the triage vital signs and the nursing notes.  Pertinent labs & imaging results that were available during my care of the patient were reviewed by me and considered in my medical decision making (see chart for details).     Patient presents with symptoms likely from a viral upper respiratory infection. Differential includes bacterial pneumonia, sinusitis, allergic rhinitis, covid 19, flu, RSV. Do not suspect underlying cardiopulmonary process. Symptoms seem unlikely related to ACS, CHF or COPD exacerbations, pneumonia, pneumothorax. Patient is nontoxic appearing and not in need of emergent medical intervention.  Rapid strep test and rapid flu were negative today in urgent care.  Throat culture and COVID-19, flu, RSV swab pending.  Patient wished to be tested for RSV after rapid flu test was completed due to her having an infant at home.  Advised patient that I am unable to order simply COVID and RSV testing and that she may be charged double for flu but patient was okay with this.  Recommended symptom control with over the counter medications: Daily oral anti-histamine, Oral decongestant or IN corticosteroid, saline irrigations, cepacol lozenges, Robitussin, Delsym, honey tea.  Cetirizine and Flonase sent to help alleviate symptoms as well as benzonatate.  Will  prescribe 5 mg cetirizine given patient's history of hepatitis C.  Will treat toenail fungus with Penlac toenail polish.  Unable to prescribe oral medications as patient has history of hepatitis C.  Will refer to podiatry.  Patient was provided with contact information.  Return if symptoms fail to improve in 1-2 weeks or you develop shortness of breath, chest pain, severe headache. Patient states understanding and is agreeable.  Discharged with PCP followup.  Final Clinical Impressions(s) / UC Diagnoses   Final diagnoses:  Viral upper respiratory tract infection with cough  Sore throat  Fungal infection of toenail     Discharge Instructions      Your rapid flu and rapid strep test were negative.  Throat culture and COVID-19 viral swabs are pending.  We will call if they are positive.  It seems that you have a viral upper respiratory infection that should resolve in the next few days with symptomatic treatment.  You have been prescribed 3 medications to help treat this.  You have also been prescribed a toenail polish to help treat toenail fungus.  Please follow-up with provided contact information for podiatry, which is a foot doctor for further evaluation management.     ED Prescriptions     Medication Sig Dispense Auth. Provider   cetirizine (ZYRTEC) 5 MG tablet Take 1 tablet (5 mg total) by mouth daily for 10 days. 10 tablet [place], Rolly Salter E, FNP   fluticasone [facility]) 50 MCG/ACT  nasal spray Place 1 spray into both nostrils daily for 3 days. 16 g Tedric Leeth, Rolly Salter E, [place]   benzonatate (TESSALON) 100 MG capsule Take 1 capsule (100 mg total) by mouth every 8 (eight) hours as needed for cough. 21 capsule Spade, Putnam E, [place]   ciclopirox [facility]) 8 % solution Apply topically at bedtime. Apply over nail and surrounding skin. Apply daily over previous coat. After seven (7) days, may remove with alcohol and continue cycle. 6.6 mL Gustavus Bryant, [place]      PDMP not reviewed this encounter.    Gustavus Bryant, [place] 12/31/20 [phone]

## 2020-12-31 NOTE — Discharge Instructions (Signed)
Your rapid flu and rapid strep test were negative.  Throat culture and COVID-19 viral swabs are pending.  We will call if they are positive.  It seems that you have a viral upper respiratory infection that should resolve in the next few days with symptomatic treatment.  You have been prescribed 3 medications to help treat this.  You have also been prescribed a toenail polish to help treat toenail fungus.  Please follow-up with provided contact information for podiatry, which is a foot doctor for further evaluation management.

## 2020-12-31 NOTE — ED Triage Notes (Signed)
Sore throat x 4 days with a cough. Feels like she's hoarse with trouble swallowing and fever. Also c/o toe fungus she wants examined

## 2021-01-01 LAB — COVID-19, FLU A+B AND RSV
Influenza A, NAA: NOT DETECTED
Influenza B, NAA: NOT DETECTED
RSV, NAA: NOT DETECTED
SARS-CoV-2, NAA: NOT DETECTED

## 2021-01-02 LAB — CULTURE, GROUP A STREP (THRC)

## 2021-02-06 ENCOUNTER — Telehealth: Payer: Self-pay | Admitting: Radiology

## 2021-02-06 NOTE — Telephone Encounter (Signed)
Called and spoke with patient to follow up on referral for [facility]. Patient states that she has not be contacted to schedule appointment. I explained that I will fax referral to [facility] and if she does not hear from them within a week to contact the office. Provided patient with office number.

## 2021-07-03 ENCOUNTER — Ambulatory Visit
Admission: EM | Admit: 2021-07-03 | Discharge: 2021-07-03 | Disposition: A | Discharge status: Home / Self Care | Payer: Medicaid Other | Attending: Emergency Medicine | Admitting: Emergency Medicine

## 2021-07-03 DIAGNOSIS — J020 Streptococcal pharyngitis: Secondary | ICD-10-CM

## 2021-07-03 DIAGNOSIS — J029 Acute pharyngitis, unspecified: Secondary | ICD-10-CM

## 2021-07-03 LAB — POCT RAPID STREP A (OFFICE): Rapid Strep A Screen: POSITIVE — AB

## 2021-07-03 MED ORDER — CEFDINIR 300 MG PO CAPS: 300.0000 mg | ORAL_CAPSULE | Freq: Two times a day (BID) | ORAL | 0 refills | Status: AC

## 2021-07-03 NOTE — ED Triage Notes (Signed)
Pt c/o headache, body aches, fatigue, and scratchy throat that began last night. ?

## 2021-07-03 NOTE — Discharge Instructions (Signed)
Your strep test today is positive.  I recommend that you begin antibiotics now for treatment.  I have sent a prescription to your pharmacy.  Please take them as prescribed.  After 24 hours of antibiotics, please discard your toothbrush as well as any other oral devices that you are currently using and replace them with new ones to avoid reinfection.  You should begin to feel better in 24 to 48 hours. ?  ?Once you have been on antibiotics for a full 24 hours, you are no longer considered contagious.   I have provided you with a note to return to work. ?  ?Even if you are feeling better, please make sure that you finish the full 10-day course and do not skip any doses.  Failure to complete a full course of antibiotics for strep throat can result in worsening infection that may require longer treatment with stronger antibiotics. ? ?Please follow-up in the next 7 to 10 days if symptoms have not completely resolved.  We are happy to see you at urgent care but you can also follow-up with your regular provider. ?  ?Thank you for visiting urgent care today.  We appreciate the opportunity to participate in your child's care. ? ?

## 2021-07-03 NOTE — ED Provider Notes (Addendum)
?[facility] ? ? ? ?CSN: [id number] ?Arrival date & time: 07/03/21  1316 ?  ? ?HISTORY  ? ?Chief Complaint  ?Patient presents with  ? Fatigue  ? Generalized Body Aches  ? Sore Throat  ? ?HPI ?Abigail Pittman is a 26 y.o. female. Patient presents urgent care today with complaints of headache, body ache, fatigue and scratchy throat that began last night.  Patient states she had a temperature of 100.6 this morning that her boyfriend advised her that she should go to the emergency room.  On arrival today, after carrying her infant in her car seat carrier into the exam room, patient had a heart rate of 127, vital signs were otherwise normal.  Of note and per my observation, patient had a tactile temperature much higher than 98.2. ? ?The history is provided by the patient.  ?Past Medical History:  ?Diagnosis Date  ? COVID-19 03/2020  ? Hepatitis C   ? History of drug use   ? Threatened miscarriage 02/17/2020  ? UTI (urinary tract infection)   ? ?Patient Active Problem List  ? Diagnosis Date Noted  ? Presence of IUD 11/27/2020  ? History of dental abscess 07/25/2020  ? History of drug use 03/27/2020  ? History of hepatitis C 03/27/2020  ? ?Past Surgical History:  ?Procedure Laterality Date  ? SHOULDER SURGERY Left   ? ?OB History   ? ? Gravida  ?2  ? Para  ?1  ? Term  ?1  ? Preterm  ?0  ? AB  ?1  ? Living  ?1  ?  ? ? SAB  ?1  ? IAB  ?0  ? Ectopic  ?0  ? Multiple  ?0  ? Live Births  ?1  ?   ?  ?  ? ?Home Medications   ? ?Prior to Admission medications   ?Not on File  ? ?Family History ?Family History  ?Problem Relation Age of Onset  ? Diverticulitis Mother   ? Pancreatic cancer Father   ? Skin cancer Father   ? Colon cancer Father   ? ?Social History ?Social History  ? ?Tobacco Use  ? Smoking status: Former  ?  Packs/day: 1.00  ?  Types: Cigarettes  ?  Quit date: 02/09/2020  ?  Years since quitting: 1.3  ? Smokeless tobacco: Never  ?Vaping Use  ? Vaping Use: Former  ? Quit date: 08/26/2019  ? Substances:  Nicotine-salt  ?Substance Use Topics  ? Alcohol use: Not Currently  ? Drug use: Not Currently  ?  Types: Marijuana, Methamphetamines, Heroin  ?  Comment: 03/27/20 6 months free  ? ?Allergies   ?Penicillins ? ?Review of Systems ?Review of Systems ?Pertinent findings noted in history of present illness.  ? ?Physical Exam ?Triage Vital Signs ?ED Triage Vitals  ?Enc Vitals Group  ?   BP 12/28/20 0827 (!) 147/82  ?   Pulse Rate 12/28/20 0827 72  ?   Resp 12/28/20 0827 18  ?   Temp 12/28/20 0827 98.3 ?F (36.8 ?C)  ?   Temp Source 12/28/20 0827 Oral  ?   SpO2 12/28/20 0827 98 %  ?   Weight --   ?   Height --   ?   Head Circumference --   ?   Peak Flow --   ?   Pain Score 12/28/20 0826 5  ?   Pain Loc --   ?   Pain Edu? --   ?   Excl. in  [place]? --   ?No data found. ? ?Updated Vital Signs ?BP 108/65 (BP Location: Right Arm)   Pulse (!) 127   Temp 98.2 ?F (36.8 ?C) (Oral)   Resp 18   SpO2 95%  ? ?Physical Exam ?Constitutional:   ?   General: She is not in acute distress. ?   Appearance: She is well-developed. She is ill-appearing. She is not toxic-appearing.  ?HENT:  ?   Head: Normocephalic and atraumatic.  ?   Salivary Glands: Right salivary gland is diffusely enlarged and tender. Left salivary gland is diffusely enlarged and tender.  ?   Right Ear: Hearing and external ear normal.  ?   Left Ear: Hearing and external ear normal.  ?   Ears:  ?   Comments: Bilateral EACs with mild erythema, bilateral TMs are normal ?   Nose: No mucosal edema, congestion or rhinorrhea.  ?   Right Turbinates: Not enlarged, swollen or pale.  ?   Left Turbinates: Not enlarged or swollen.  ?   Right Sinus: No maxillary sinus tenderness or frontal sinus tenderness.  ?   Left Sinus: No maxillary sinus tenderness or frontal sinus tenderness.  ?   Mouth/Throat:  ?   Lips: Pink. No lesions.  ?   Mouth: Mucous membranes are moist. No oral lesions or angioedema.  ?   Dentition: No gingival swelling.  ?   Tongue: No lesions.  ?   Palate: No mass.  ?    Pharynx: Uvula midline. Pharyngeal swelling, oropharyngeal exudate and posterior oropharyngeal erythema present. No uvula swelling.  ?   Tonsils: Tonsillar exudate present. 2+ on the right. 2+ on the left.  ?Eyes:  ?   Extraocular Movements: Extraocular movements intact.  ?   Conjunctiva/sclera: Conjunctivae normal.  ?   Pupils: Pupils are equal, round, and reactive to light.  ?Neck:  ?   Thyroid: No thyroid mass, thyromegaly or thyroid tenderness.  ?   Trachea: Tracheal tenderness present. No abnormal tracheal secretions or tracheal deviation.  ?   Comments: Voice is muffled ?Cardiovascular:  ?   Rate and Rhythm: Normal rate and regular rhythm.  ?   Pulses: Normal pulses.  ?   Heart sounds: Normal heart sounds, S1 normal and S2 normal. No murmur heard. ?  No friction rub. No gallop.  ?Pulmonary:  ?   Effort: Pulmonary effort is normal. No accessory muscle usage, prolonged expiration, respiratory distress or retractions.  ?   Breath sounds: No stridor, decreased air movement or transmitted upper airway sounds. No decreased breath sounds, wheezing, rhonchi or rales.  ?Abdominal:  ?   General: Bowel sounds are normal.  ?   Palpations: Abdomen is soft.  ?   Tenderness: There is generalized abdominal tenderness. There is no right CVA tenderness, left CVA tenderness or rebound. Negative signs include Murphy's sign.  ?   Hernia: No hernia is present.  ?Musculoskeletal:     ?   General: No tenderness. Normal range of motion.  ?   Cervical back: Full passive range of motion without pain, normal range of motion and neck supple.  ?   Right lower leg: No edema.  ?   Left lower leg: No edema.  ?Lymphadenopathy:  ?   Cervical: Cervical adenopathy present.  ?   Right cervical: Superficial cervical adenopathy present.  ?   Left cervical: Superficial cervical adenopathy present.  ?Skin: ?   General: Skin is warm and dry.  ?   Findings: No erythema, lesion  or rash.  ?Neurological:  ?   General: No focal deficit present.  ?   Mental  Status: She is alert and oriented to person, place, and time. Mental status is at baseline.  ?Psychiatric:     ?   Mood and Affect: Mood normal.     ?   Behavior: Behavior normal.     ?   Thought Content: Thought content normal.     ?   Judgment: Judgment normal.  ? ? ?Visual Acuity ?Right Eye Distance:   ?Left Eye Distance:   ?Bilateral Distance:   ? ?Right Eye Near:   ?Left Eye Near:    ?Bilateral Near:    ? ?UC Couse / Diagnostics / Procedures:  ?  ?EKG ? ?Radiology ?No results found. ? ?Procedures ?Procedures (including critical care time) ? ?UC Diagnoses / Final Clinical Impressions(s)   ?I have reviewed the triage vital signs and the nursing notes. ? ?Pertinent labs & imaging results that were available during my care of the patient were reviewed by me and considered in my medical decision making (see chart for details).   ?Final diagnoses:  ?Streptococcal pharyngitis  ?Pharyngitis, unspecified etiology  ? ?Rapid strep test is positive.  Patient provided with a 10-day course of Keflex.  Patient advised to take all as prescribed.  Patient advised that she is contagious for the next 24 hours, note provided to return to work. ? ?Addendum: After patient was advised of her discharge diagnosis and instructions, patient requested testing for influenza.  Send out COVID flu test was performed, patient advised we will notify her of the results once received. ? ?ED Prescriptions   ? ? Medication Sig Dispense Auth. Provider  ? cefdinir (OMNICEF) 300 MG capsule Take 1 capsule (300 mg total) by mouth 2 (two) times daily for 10 days. 20 capsule Lynden Oxford Scales, PA-C  ? ?  ? ?PDMP not reviewed this encounter. ? ?Pending results:  ?Labs Reviewed  ?POCT RAPID STREP A (OFFICE) - Abnormal; Notable for the following components:  ?    Result Value  ? Rapid Strep A Screen Positive (*)   ? All other components within normal limits  ?COVID-19, FLU A+B NAA  ? ? ?Medications Ordered in UC: ?Medications - No data to  display ? ?Disposition Upon Discharge:  ?Condition: stable for discharge home ?Home: take medications as prescribed; routine discharge instructions as discussed; follow up as advised. ? ?Patient presented with an acute illness

## 2021-07-05 LAB — COVID-19, FLU A+B NAA
Influenza A, NAA: NOT DETECTED
Influenza B, NAA: NOT DETECTED
SARS-CoV-2, NAA: NOT DETECTED

## 2021-12-22 ENCOUNTER — Ambulatory Visit
Admission: EM | Admit: 2021-12-22 | Discharge: 2021-12-22 | Disposition: A | Discharge status: Home / Self Care | Payer: Medicaid Other | Attending: Physician Assistant | Admitting: Physician Assistant

## 2021-12-22 DIAGNOSIS — Z1152 Encounter for screening for COVID-19: Secondary | ICD-10-CM | POA: Diagnosis not present

## 2021-12-22 DIAGNOSIS — J069 Acute upper respiratory infection, unspecified: Secondary | ICD-10-CM | POA: Diagnosis not present

## 2021-12-22 NOTE — ED Triage Notes (Signed)
Pt reports having hoarseness, productive cough, and chest pressure.  Started: 2 days ago to now  Home interventions: salt water gargle, dayquil

## 2021-12-22 NOTE — ED Provider Notes (Signed)
[facility]    CSN: [id number] Arrival date & time: 12/22/21  1430      History   Chief Complaint Chief Complaint  Patient presents with   Cough   Hoarse    HPI Abigail Pittman is a 26 y.o. female.   Here today for evaluation of productive cough, chest pressure and hoarseness that started 2 days ago.  She reports that she did have sore throat but this is improved.  She did have some ear discomfort when she had sore throat but this is also improved.  She denies any nausea vomiting diarrhea.  She has not had fever.  She does have a baby at home and is somewhat concerned for RSV.  She has tried salt water gargles and DayQuil without significant relief.  The history is provided by the patient.  Cough Associated symptoms: sore throat (resolved)   Associated symptoms: no chills, no ear pain, no eye discharge, no fever, no shortness of breath and no wheezing     Past Medical History:  Diagnosis Date   COVID-19 03/2020   Hepatitis C    History of drug use    Threatened miscarriage 02/17/2020   UTI (urinary tract infection)     Patient Active Problem List   Diagnosis Date Noted   Presence of IUD 11/27/2020   History of dental abscess 07/25/2020   History of drug use 03/27/2020   History of hepatitis C 03/27/2020    Past Surgical History:  Procedure Laterality Date   SHOULDER SURGERY Left     OB History     Gravida  2   Para  1   Term  1   Preterm  0   AB  1   Living  1      SAB  1   IAB  0   Ectopic  0   Multiple  0   Live Births  1            Home Medications    Prior to Admission medications   Not on File    Family History Family History  Problem Relation Age of Onset   Diverticulitis Mother    Pancreatic cancer Father    Skin cancer Father    Colon cancer Father     Social History Social History   Tobacco Use   Smoking status: Former    Packs/day: 1.00    Types: Cigarettes    Quit date: 02/09/2020     Years since quitting: 1.8   Smokeless tobacco: Never  Vaping Use   Vaping Use: Former   Quit date: 08/26/2019   Substances: Nicotine-salt  Substance Use Topics   Alcohol use: Not Currently   Drug use: Not Currently    Types: Marijuana, Methamphetamines, Heroin    Comment: 03/27/20 6 months free     Allergies   Penicillins   Review of Systems Review of Systems  Constitutional:  Negative for chills and fever.  HENT:  Positive for congestion, sore throat (resolved) and voice change. Negative for ear pain.   Eyes:  Negative for discharge and redness.  Respiratory:  Positive for cough. Negative for shortness of breath and wheezing.   Gastrointestinal:  Negative for abdominal pain, diarrhea, nausea and vomiting.     Physical Exam Triage Vital Signs ED Triage Vitals  Enc Vitals Group     BP 12/22/21 1444 121/75     Pulse Rate 12/22/21 1444 80     Resp 12/22/21 1444 16  Temp 12/22/21 1444 98 F (36.7 C)     Temp Source 12/22/21 1444 Oral     SpO2 12/22/21 1444 96 %     Weight --      Height --      Head Circumference --      Peak Flow --      Pain Score 12/22/21 1449 2     Pain Loc --      Pain Edu? --      Excl. in [place]? --    No data found.  Updated Vital Signs BP 121/75 (BP Location: Right Arm)   Pulse 80   Temp 98 F (36.7 C) (Oral)   Resp 16   SpO2 96%      Physical Exam Vitals and nursing note reviewed.  Constitutional:      General: She is not in acute distress.    Appearance: Normal appearance. She is not ill-appearing.  HENT:     Head: Normocephalic and atraumatic.     Nose: Congestion present.     Mouth/Throat:     Mouth: Mucous membranes are moist.     Pharynx: No oropharyngeal exudate or posterior oropharyngeal erythema.  Eyes:     Conjunctiva/sclera: Conjunctivae normal.  Cardiovascular:     Rate and Rhythm: Normal rate and regular rhythm.     Heart sounds: Normal heart sounds. No murmur heard. Pulmonary:     Effort: Pulmonary effort is  normal. No respiratory distress.     Breath sounds: Normal breath sounds. No wheezing, rhonchi or rales.  Skin:    General: Skin is warm and dry.  Neurological:     Mental Status: She is alert.  Psychiatric:        Mood and Affect: Mood normal.        Thought Content: Thought content normal.      UC Treatments / Results  Labs (all labs ordered are listed, but only abnormal results are displayed) Labs Reviewed  RESP PANEL BY RT-PCR (RSV, FLU A&B, COVID)  [id number]    EKG   Radiology No results found.  Procedures Procedures (including critical care time)  Medications Ordered in UC Medications - No data to display  Initial Impression / Assessment and Plan / UC Course  I have reviewed the triage vital signs and the nursing notes.  Pertinent labs & imaging results that were available during my care of the patient were reviewed by me and considered in my medical decision making (see chart for details).    Suspect viral etiology of symptoms.  Will screen for COVID, flu and RSV given small child at home.  Encouraged follow-up if symptoms do not improve or with any further concerns.  Recommend symptomatic treatment while awaiting results.  Final Clinical Impressions(s) / UC Diagnoses   Final diagnoses:  Acute upper respiratory infection  Encounter for screening for COVID-19   Discharge Instructions   None    ED Prescriptions   None    PDMP not reviewed this encounter.   Francene Finders, PA-C 12/22/21 1515

## 2021-12-23 LAB — RESP PANEL BY RT-PCR (RSV, FLU A&B, COVID)  RVPGX2
Influenza A by PCR: NEGATIVE
Influenza B by PCR: NEGATIVE
Resp Syncytial Virus by PCR: NEGATIVE
SARS Coronavirus 2 by RT PCR: NEGATIVE

## 2022-03-25 ENCOUNTER — Telehealth: Payer: Self-pay

## 2022-03-25 ENCOUNTER — Other Ambulatory Visit (HOSPITAL_COMMUNITY): Payer: Self-pay

## 2022-03-25 NOTE — Telephone Encounter (Signed)
[facility] Patient Advocate Encounter  Insurance verification completed.    The patient is insured through [place] [employer] and has a $4.00 copay.  We will continue to follow to see if copay assistance is needed.  Caree Wolpert, CPhT [facility] Phone: [phone] Fax:  [phone]  

## 2022-03-26 ENCOUNTER — Encounter: Payer: Medicaid Other | Admitting: Infectious Diseases

## 2022-04-08 ENCOUNTER — Other Ambulatory Visit: Payer: Self-pay

## 2022-04-08 ENCOUNTER — Ambulatory Visit (INDEPENDENT_AMBULATORY_CARE_PROVIDER_SITE_OTHER): Payer: Medicaid Other | Admitting: Infectious Diseases

## 2022-04-08 ENCOUNTER — Encounter: Payer: Self-pay | Admitting: Infectious Diseases

## 2022-04-08 VITALS — BP 127/79 | HR 92 | Temp 98.0°F | Ht 65.5 in | Wt 193.6 lb

## 2022-04-08 DIAGNOSIS — Z8619 Personal history of other infectious and parasitic diseases: Secondary | ICD-10-CM

## 2022-04-08 DIAGNOSIS — Z1159 Encounter for screening for other viral diseases: Secondary | ICD-10-CM | POA: Insufficient documentation

## 2022-04-08 DIAGNOSIS — F1991 Other psychoactive substance use, unspecified, in remission: Secondary | ICD-10-CM | POA: Diagnosis not present

## 2022-04-08 DIAGNOSIS — Z114 Encounter for screening for human immunodeficiency virus [HIV]: Secondary | ICD-10-CM | POA: Diagnosis not present

## 2022-04-08 NOTE — Progress Notes (Signed)
[facility]                                      [address], [place], [place], [address]                                               Phn. [phone]; Fax: [phone]                                                               Date: 04/08/22 Reason for Visit: Hepatitis C    HPI: Abigail Pittman is a 27 y.o.old female with PMH of IVDU, COVID 1  and chronic Hepatitis C who is referred from PCP for evaluation and management of Hepatitis C  Initially diagnosed at the age of 89- no prior evaluation and treatment done, HCV RNA has tested positive consistently in 2015 and 2022.   Denies h/o injectable IVDU but h/o drug use noted in chart, denies blood transfusion, possibly sharing of toothbrushes/razors during young age+. Her current fiance was also positive for Hepatitis C and spontaneously cleared. Incarcerated in 2018, 2019, 2021 which she tells is due to bad life choices. Father had h/o pancreatic ca and alcohol related liver cirrhosis. Has not received tx to date.   VAPES, alcohol occasionally and denies IVDU currently  Lives with fiance and has a 22 month old child.   Denies any hospitalizations related to liver disease, jaundice, ascites, GI bleeding, mental status changes, abdominal pain and acholic stool. She has no acute complaints today.   ROS: Denies yellowish discoloration of sclera and skin, abdominal pain/distension, hematemesis.            Denies fever, chills, nightsweats, nausea, vomiting, diarrhea, constipation, weight loss, recent hospitalizations, rashes, joint complaints, shortness of breath, chest pain, headaches, dysuria .  No current outpatient medications on file prior to visit.   No current facility-administered medications on file prior to visit.    Allergies  Allergen Reactions   Penicillins Hives    07/2020: took keflex okay?   Past Medical History:  Diagnosis Date    COVID-19 03/2020   Hepatitis C    History of drug use    Threatened miscarriage 02/17/2020   UTI (urinary tract infection)    Past Surgical History:  Procedure Laterality Date   SHOULDER SURGERY Left    Social History   Socioeconomic History   Marital status: Single    Spouse name: Not on file   Number of children: Not on file   Years of education: Not on file   Highest education level: Not on file  Occupational History   Not on file  Tobacco Use   Smoking status: Former    Packs/day: 1.00    Types: Cigarettes    Quit date: 02/09/2020    Years since quitting: 2.1   Smokeless tobacco: Never  Vaping Use   Vaping Use: Former   Quit date: 08/26/2019   Substances: Nicotine-salt  Substance and Sexual Activity   Alcohol use: Not Currently   Drug use: Not  Currently    Types: Marijuana, Methamphetamines, Heroin    Comment: 03/27/20 6 months free   Sexual activity: Yes    Birth control/protection: None  Other Topics Concern   Not on file  Social History Narrative   Not on file   Social Determinants of Health   Financial Resource Strain: Not on file  Food Insecurity: No Food Insecurity (10/10/2020)   Hunger Vital Sign    Worried About Running Out of Food in the Last Year: Never true    Ran Out of Food in the Last Year: Never true  Transportation Needs: No Transportation Needs (10/10/2020)   PRAPARE - Hydrologist (Medical): No    Lack of Transportation (Non-Medical): No  Physical Activity: Not on file  Stress: Not on file  Social Connections: Not on file  Intimate Partner Violence: Not At Risk (04/02/2020)   Humiliation, Afraid, Rape, and Kick questionnaire    Fear of Current or Ex-Partner: No    Emotionally Abused: No    Physically Abused: No    Sexually Abused: No   Family History  Problem Relation Age of Onset   Diverticulitis Mother    Pancreatic cancer Father    Skin cancer Father    Colon cancer Father    Vitals  BP 127/79    Pulse 92   Temp 98 F (36.7 C) (Oral)   Ht 5' 5.5" (1.664 m)   Wt 193 lb 9.6 oz (87.8 kg)   SpO2 98%   BMI 31.73 kg/m    Gen:  no acute distress HEENT: [place]/AT, no scleral icterus, no pale conjunctivae, hearing normal, oral mucosa moist Neck: Supple Cardio: Regular rate and rhythm Resp: Pulmonary effort normal on room air GI: Soft, nontender, nondistended GU: MSK - no pedal edema  Skin- no rashes  Neuro: Grossly non focal, awake, alert and oriented * 3 Psych: Calm, cooperative   Laboratory      Latest Ref Rng & Units 10/24/2020    5:29 AM 10/22/2020    5:19 PM 07/25/2020    9:49 AM  CBC  WBC 4.0 - 10.5 K/uL 13.9  8.0  6.2   Hemoglobin 12.0 - 15.0 g/dL 11.1  12.8  12.3   Hematocrit 36.0 - 46.0 % 31.7  37.9  36.7   Platelets 150 - 400 K/uL 224  259  264       Latest Ref Rng & Units 08/22/2020   11:06 AM 07/25/2020    9:50 AM 02/17/2020    2:00 PM  CMP  Glucose 65 - 99 mg/dL 70  55  96   BUN 6 - 20 mg/dL 10  6  12    Creatinine 0.57 - 1.00 mg/dL 0.59  0.61  0.74   Sodium 134 - 144 mmol/L 138  137  137   Potassium 3.5 - 5.2 mmol/L 4.4  3.8  3.7   Chloride 96 - 106 mmol/L 106  104  103   CO2 20 - 29 mmol/L 17  18  25    Calcium 8.7 - 10.2 mg/dL 8.6  8.7  9.5   Total Protein 6.0 - 8.5 g/dL 6.1  6.2  6.9   Total Bilirubin 0.0 - 1.2 mg/dL 0.4  0.5  0.6   Alkaline Phos 44 - 121 IU/L 108  93  79   AST 0 - 40 IU/L 20  22  35   ALT 0 - 32 IU/L 19  21  39     Assessment/Plan: #  Chronic Hepatitis C Prior treatment: no  [id number]  Evidence of cirrhosis: US abdomen and fibro test ordered  Interested in treatment: yes Potential DDI: possibly none   Fu to be made pending labs/US from today or as needed   # H/o IVDU  - Declines currently   # Immunization counseling  Will check for Hepatitis A and B serology and vaccinate if non immune    I have personally spent more than 70 minutes involved in face-to-face and non-face-to-face activities for this patient on the day of the  visit. Professional time spent includes the following activities: Preparing to see the patient (review of tests), Obtaining and/or reviewing separately obtained history (admission/discharge record), Performing a medically appropriate examination and/or evaluation , Ordering medications/tests/procedures, referring and communicating with other health care professionals, Documenting clinical information in the EMR, Independently interpreting results (not separately reported), Communicating results to the patient/family/caregiver, Counseling and educating the patient/family/caregiver and Care coordination (not separately reported).   Patients questions were addressed and answered.   Electronically signed by:  Rosiland Oz, MD Infectious Diseases  Office phone [phone] Fax no. [phone]

## 2022-04-10 ENCOUNTER — Ambulatory Visit (HOSPITAL_COMMUNITY)
Admission: RE | Admit: 2022-04-10 | Discharge: 2022-04-10 | Disposition: A | Discharge status: Home / Self Care | Payer: Medicaid Other | Source: Ambulatory Visit | Attending: Infectious Diseases | Admitting: Infectious Diseases

## 2022-04-10 DIAGNOSIS — Z8619 Personal history of other infectious and parasitic diseases: Secondary | ICD-10-CM | POA: Insufficient documentation

## 2022-04-12 LAB — HEPATIC FUNCTION PANEL
AG Ratio: 1.3 (calc) (ref 1.0–2.5)
ALT: 37 U/L — ABNORMAL HIGH (ref 6–29)
AST: 35 U/L — ABNORMAL HIGH (ref 10–30)
Albumin: 4.3 g/dL (ref 3.6–5.1)
Alkaline phosphatase (APISO): 84 U/L (ref 31–125)
Bilirubin, Direct: 0.1 mg/dL (ref 0.0–0.2)
Globulin: 3.2 g/dL (calc) (ref 1.9–3.7)
Indirect Bilirubin: 0.3 mg/dL (calc) (ref 0.2–1.2)
Total Bilirubin: 0.4 mg/dL (ref 0.2–1.2)
Total Protein: 7.5 g/dL (ref 6.1–8.1)

## 2022-04-12 LAB — LIVER FIBROSIS, FIBROTEST-ACTITEST
ALT: 37 U/L — ABNORMAL HIGH (ref 6–29)
Alpha-2-Macroglobulin: 255 mg/dL (ref 106–279)
Apolipoprotein A1: 157 mg/dL (ref 101–198)
Bilirubin: 0.4 mg/dL (ref 0.2–1.2)
Fibrosis Score: 0.15
GGT: 36 U/L (ref 3–40)
Haptoglobin: 90 mg/dL (ref 43–212)
Necroinflammat ACT Score: 0.16
Reference ID: 4761534

## 2022-04-12 LAB — CBC
HCT: 40.5 % (ref 35.0–45.0)
Hemoglobin: 14 g/dL (ref 11.7–15.5)
MCH: 30.8 pg (ref 27.0–33.0)
MCHC: 34.6 g/dL (ref 32.0–36.0)
MCV: 89 fL (ref 80.0–100.0)
MPV: 9.7 fL (ref 7.5–12.5)
Platelets: 257 10*3/uL (ref 140–400)
RBC: 4.55 10*6/uL (ref 3.80–5.10)
RDW: 12.1 % (ref 11.0–15.0)
WBC: 6.2 10*3/uL (ref 3.8–10.8)

## 2022-04-12 LAB — HEPATITIS C GENOTYPE

## 2022-04-12 LAB — HEPATITIS B SURFACE ANTIGEN: Hepatitis B Surface Ag: NONREACTIVE

## 2022-04-12 LAB — PROTIME-INR
INR: 1
Prothrombin Time: 10.7 s (ref 9.0–11.5)

## 2022-04-12 LAB — HEPATITIS B SURFACE ANTIBODY,QUALITATIVE: Hep B S Ab: REACTIVE — AB

## 2022-04-12 LAB — HEPATITIS B CORE ANTIBODY, TOTAL: Hep B Core Total Ab: NONREACTIVE

## 2022-04-12 LAB — HEPATITIS A ANTIBODY, TOTAL: Hepatitis A AB,Total: NONREACTIVE

## 2022-04-12 LAB — HIV ANTIBODY (ROUTINE TESTING W REFLEX): HIV 1&2 Ab, 4th Generation: NONREACTIVE

## 2022-04-12 LAB — HEPATITIS C RNA QUANTITATIVE
HCV Quantitative Log: 6.99 log IU/mL — ABNORMAL HIGH
HCV RNA, PCR, QN: 9850000 IU/mL — ABNORMAL HIGH

## 2022-04-14 ENCOUNTER — Other Ambulatory Visit (HOSPITAL_COMMUNITY): Payer: Self-pay

## 2022-04-14 ENCOUNTER — Other Ambulatory Visit: Payer: Self-pay | Admitting: Pharmacist

## 2022-04-14 ENCOUNTER — Other Ambulatory Visit: Payer: Self-pay

## 2022-04-14 DIAGNOSIS — B182 Chronic viral hepatitis C: Secondary | ICD-10-CM

## 2022-04-14 MED ORDER — SOFOSBUVIR-VELPATASVIR 400-100 MG PO TABS
1.0000 | ORAL_TABLET | Freq: Every day | ORAL | 2 refills | Status: DC
  Filled 2022-04-14 – 2022-04-15 (×2): qty 28, 28d supply, fill #0
  Filled 2022-05-06: qty 28, 28d supply, fill #1
  Filled 2022-06-03: qty 28, 28d supply, fill #2

## 2022-04-14 NOTE — Progress Notes (Signed)
Sounds good! Her insurance prefers Raeanne Gathers so we will go with that. Thank you!

## 2022-04-15 ENCOUNTER — Telehealth: Payer: Self-pay | Admitting: Pharmacist

## 2022-04-15 ENCOUNTER — Other Ambulatory Visit (HOSPITAL_COMMUNITY): Payer: Self-pay

## 2022-04-15 ENCOUNTER — Other Ambulatory Visit: Payer: Self-pay

## 2022-04-15 NOTE — Telephone Encounter (Signed)
Patient is approved to receive Epclusa x 12 weeks for chronic Hepatitis C infection. Counseled patient to take Epclusa daily with or without food. Encouraged patient not to miss any doses and explained how their chance of cure could go down with each dose missed. Counseled patient on what to do if dose is missed - if it is closer to the missed dose take immediately; if closer to next dose skip dose and take the next dose at the usual time. Counseled patient on common side effects such as headache, fatigue, and nausea and that these normally decrease with time. I reviewed patient medications and found no drug interactions. Discussed with patient that there are several drug interactions including acid suppressants. Instructed patient to call clinic if she wishes to start a new medication during course of therapy. Also advised patient to call if she experiences any side effects. Patient will follow-up with me in the pharmacy clinic on 3/14.  Alfonse Spruce, PharmD, CPP, BCIDP, [place] Clinical Pharmacist Practitioner [facility]

## 2022-04-16 ENCOUNTER — Other Ambulatory Visit (HOSPITAL_COMMUNITY): Payer: Self-pay

## 2022-04-28 ENCOUNTER — Other Ambulatory Visit (HOSPITAL_COMMUNITY): Payer: Self-pay

## 2022-05-06 ENCOUNTER — Telehealth: Payer: Self-pay

## 2022-05-06 ENCOUNTER — Other Ambulatory Visit (HOSPITAL_COMMUNITY): Payer: Self-pay

## 2022-05-06 NOTE — Telephone Encounter (Signed)
Patient called with question as to whether Epclusa interacts with Mucinex. I relayed to the patient that these two medications do not interact and confirmed that she is not taking any other OTC or prescribed medications. Patient also complained of continued headache since Epclusa initiation and was counseled to take ibuprofen.

## 2022-05-08 ENCOUNTER — Other Ambulatory Visit (HOSPITAL_COMMUNITY): Payer: Self-pay

## 2022-05-15 ENCOUNTER — Ambulatory Visit (INDEPENDENT_AMBULATORY_CARE_PROVIDER_SITE_OTHER): Payer: Medicaid Other | Admitting: Pharmacist

## 2022-05-15 ENCOUNTER — Other Ambulatory Visit: Payer: Self-pay

## 2022-05-15 DIAGNOSIS — Z8619 Personal history of other infectious and parasitic diseases: Secondary | ICD-10-CM

## 2022-05-15 DIAGNOSIS — B182 Chronic viral hepatitis C: Secondary | ICD-10-CM

## 2022-05-15 DIAGNOSIS — Z23 Encounter for immunization: Secondary | ICD-10-CM

## 2022-05-15 NOTE — Progress Notes (Signed)
05/15/2022  HPI: Abigail Pittman is a 27 y.o. female who presents to the [facility] for Hepatitis C follow-up.  Medication: Epclusa x 12 weeks  Start Date: 04/17/22 (chart reports 2/14 as chart date but patient reports delay in therapy)  Hepatitis C Genotype: 1a  Fibrosis Score: F0  Hepatitis C RNA: 9,850,000  Patient Active Problem List   Diagnosis Date Noted   Encounter for screening for HIV 04/08/2022   Need for hepatitis B screening test 04/08/2022   Presence of IUD 11/27/2020   History of dental abscess 07/25/2020   History of drug use 03/27/2020   History of hepatitis C 03/27/2020    Patient's Medications  New Prescriptions   No medications on file  Previous Medications   SOFOSBUVIR-VELPATASVIR (EPCLUSA) 400-100 MG TABS    Take 1 tablet by mouth daily.  Modified Medications   No medications on file  Discontinued Medications   No medications on file    Allergies: Allergies  Allergen Reactions   Penicillins Hives    07/2020: took keflex okay?    Past Medical History: Past Medical History:  Diagnosis Date   COVID-19 03/2020   Hepatitis C    History of drug use    Threatened miscarriage 02/17/2020   UTI (urinary tract infection)     Social History: Social History   Socioeconomic History   Marital status: Single    Spouse name: Not on file   Number of children: Not on file   Years of education: Not on file   Highest education level: Not on file  Occupational History   Not on file  Tobacco Use   Smoking status: Former    Packs/day: 1    Types: Cigarettes    Quit date: 02/09/2020    Years since quitting: 2.2   Smokeless tobacco: Never  Vaping Use   Vaping Use: Former   Quit date: 08/26/2019   Substances: Nicotine-salt  Substance and Sexual Activity   Alcohol use: Not Currently   Drug use: Not Currently    Types: Marijuana, Methamphetamines, Heroin    Comment: 03/27/20 6 months free   Sexual activity: Yes    Birth  control/protection: None  Other Topics Concern   Not on file  Social History Narrative   Not on file   Social Determinants of Health   Financial Resource Strain: Not on file  Food Insecurity: No Food Insecurity (10/10/2020)   Hunger Vital Sign    Worried About Running Out of Food in the Last Year: Never true    Ran Out of Food in the Last Year: Never true  Transportation Needs: No Transportation Needs (10/10/2020)   PRAPARE - Hydrologist (Medical): No    Lack of Transportation (Non-Medical): No  Physical Activity: Not on file  Stress: Not on file  Social Connections: Not on file    Labs: Hepatitis C Lab Results  Component Value Date   HCVGENOTYPE 1a 04/08/2022   HCVRNAPCRQN 9,850,000 (H) 04/08/2022   FIBROSTAGE F0 04/08/2022   Hepatitis B Lab Results  Component Value Date   HEPBSAB REACTIVE (A) 04/08/2022   HEPBSAG NON-REACTIVE 04/08/2022   HEPBCAB NON-REACTIVE 04/08/2022   Hepatitis A Lab Results  Component Value Date   HAV NON-REACTIVE 04/08/2022   HIV Lab Results  Component Value Date   HIV NON-REACTIVE 04/08/2022   HIV Non Reactive 07/25/2020   HIV Non Reactive 04/02/2020   Lab Results  Component Value Date   CREATININE 0.59  08/22/2020   CREATININE 0.61 07/25/2020   CREATININE 0.74 02/17/2020   CREATININE 0.64 02/09/2020   Lab Results  Component Value Date   AST 35 (H) 04/08/2022   AST 20 08/22/2020   AST 22 07/25/2020   ALT 37 (H) 04/08/2022   ALT 37 (H) 04/08/2022   ALT 19 08/22/2020   INR 1.0 04/08/2022    Assessment: Abigail Pittman presents today for her one-month follow-up of HCV on Epclusa x 12 weeks. She started her second box of Epclusa 2 days ago. Patient had severe headaches and felt very tired during the first few days of treatment but reports feeling fine now. She describes one day (~1 week ago) where she accidentally took two doses of Epclusa ~2 hours apart. She reports GI upset the following day but has felt fine  since then. Will get HCV RNA and CMP today. Patient will follow-up with Dr. West Bali in 2 months for her end of therapy appointment.   Asked patient if she would be interested in beginning the HepA vaccine series today given her nonreactive HepA Ab. She agreed to start the series today.    Plan: - Get HCV RNA, CMP  - Give first dose of HepA vaccine series  - Follow-up for end of therapy appointment on 07/22/22 with Dr. West Bali - Please don't hesitate to call with any questions   Billey Gosling, PharmD PGY1 Pharmacy Resident 3/14/202410:28 AM

## 2022-05-17 LAB — COMPREHENSIVE METABOLIC PANEL
AG Ratio: 1.6 (calc) (ref 1.0–2.5)
ALT: 12 U/L (ref 6–29)
AST: 15 U/L (ref 10–30)
Albumin: 4.4 g/dL (ref 3.6–5.1)
Alkaline phosphatase (APISO): 63 U/L (ref 31–125)
BUN: 14 mg/dL (ref 7–25)
CO2: 25 mmol/L (ref 20–32)
Calcium: 9.4 mg/dL (ref 8.6–10.2)
Chloride: 105 mmol/L (ref 98–110)
Creat: 0.71 mg/dL (ref 0.50–0.96)
Globulin: 2.7 g/dL (calc) (ref 1.9–3.7)
Glucose, Bld: 61 mg/dL — ABNORMAL LOW (ref 65–99)
Potassium: 4.3 mmol/L (ref 3.5–5.3)
Sodium: 139 mmol/L (ref 135–146)
Total Bilirubin: 0.4 mg/dL (ref 0.2–1.2)
Total Protein: 7.1 g/dL (ref 6.1–8.1)

## 2022-05-17 LAB — HEPATITIS C RNA QUANTITATIVE
HCV Quantitative Log: <1.18 log IU/mL — ABNORMAL HIGH
HCV RNA, PCR, QN: <15 IU/mL — ABNORMAL HIGH

## 2022-06-03 ENCOUNTER — Other Ambulatory Visit (HOSPITAL_COMMUNITY): Payer: Self-pay

## 2022-06-04 ENCOUNTER — Other Ambulatory Visit: Payer: Self-pay

## 2022-06-24 ENCOUNTER — Other Ambulatory Visit: Payer: Self-pay

## 2022-06-24 ENCOUNTER — Ambulatory Visit: Payer: Medicaid Other | Admitting: Pharmacist

## 2022-06-26 ENCOUNTER — Other Ambulatory Visit (HOSPITAL_COMMUNITY): Payer: Self-pay

## 2022-07-15 ENCOUNTER — Ambulatory Visit: Payer: Medicaid Other | Admitting: Pharmacist

## 2022-07-20 IMAGING — US US MFM OB FOLLOW-UP
1 series · 14 of 28 positions shown · non-contrast
Comparison: none

[Series 1: us mfm ob follow-up · 14 of 42 slices shown]
[im 2/42]
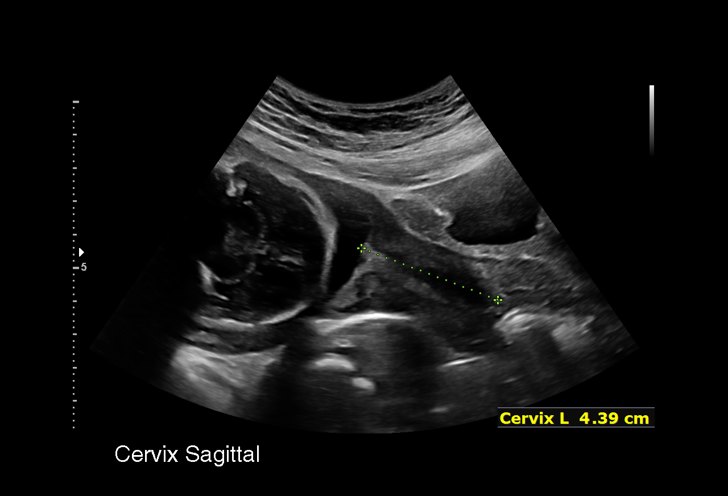
[im 5/42]
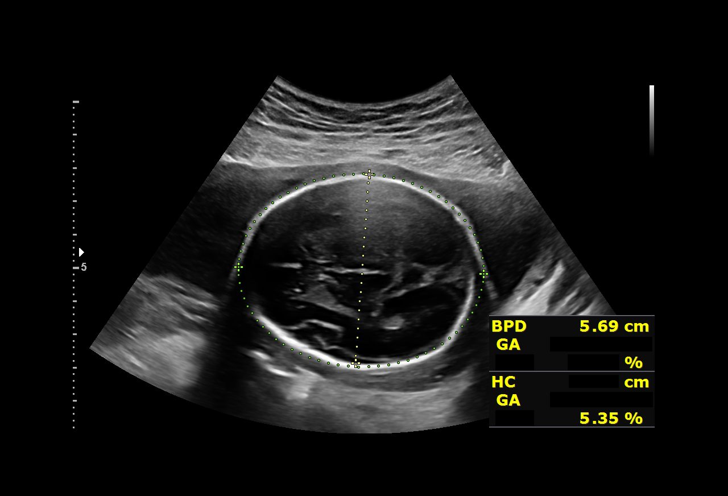
[im 8/42]
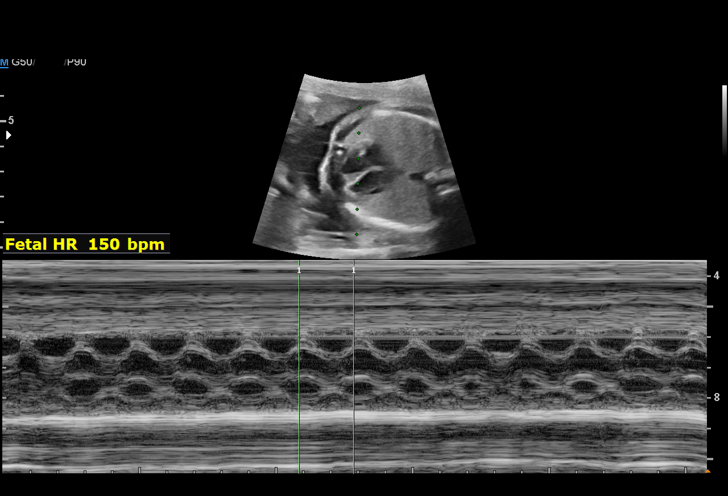
[im 11/42]
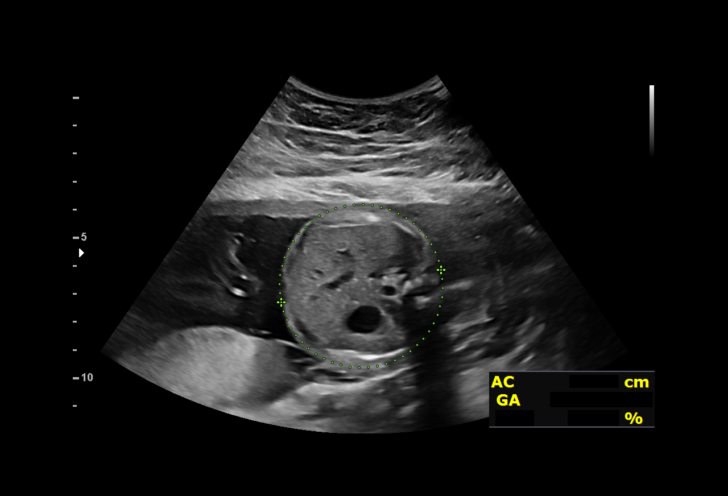
[im 14/42]
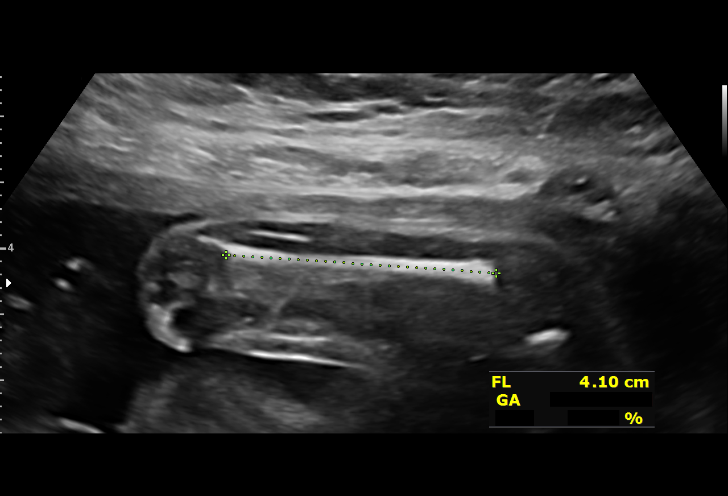
[im 17/42]
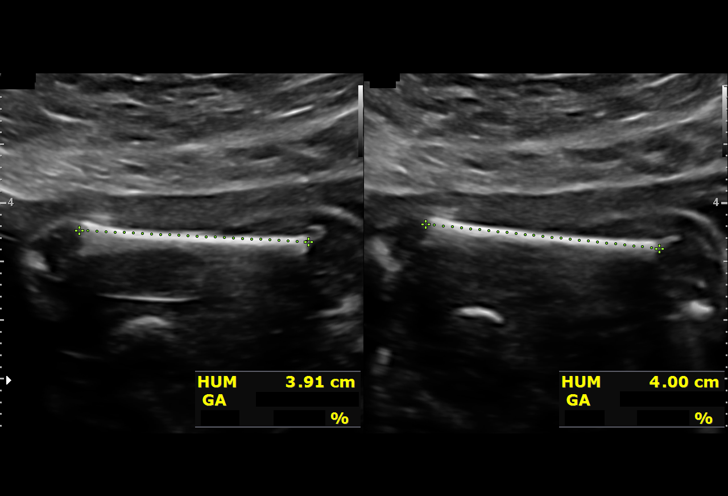
[im 20/42]
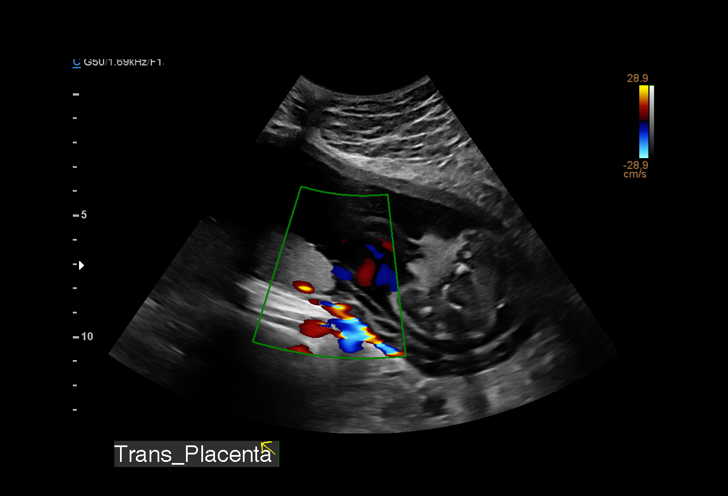
[im 23/42]
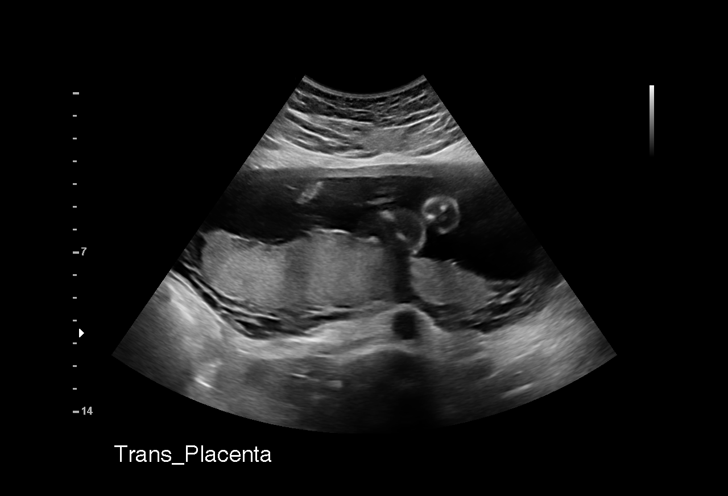
[im 26/42]
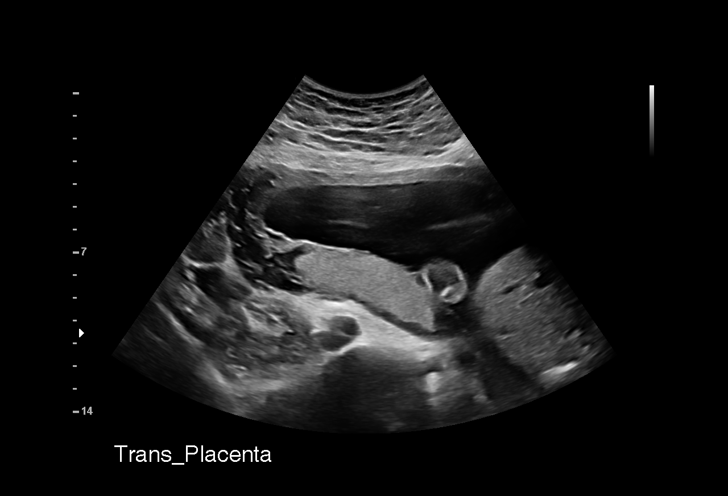
[im 29/42]
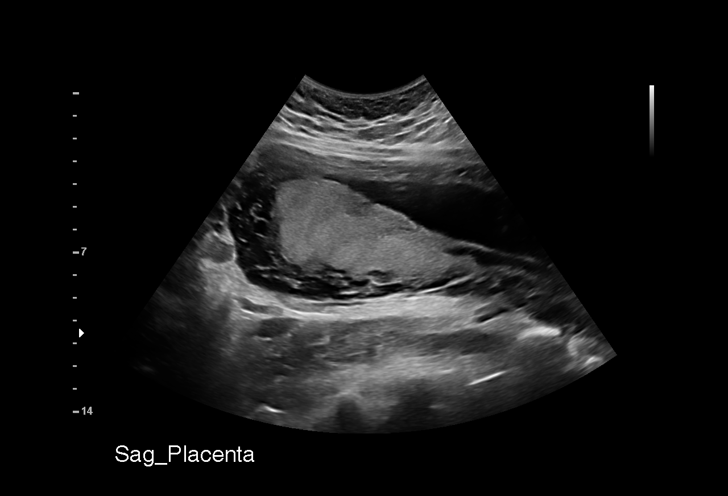
[im 32/42]
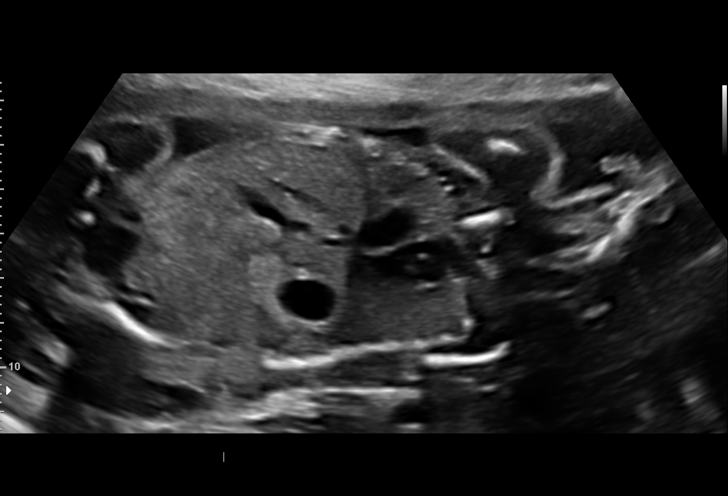
[im 35/42]
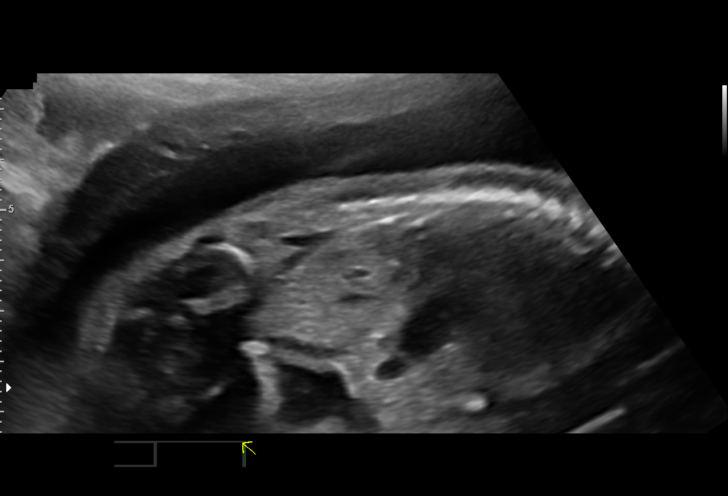
[im 38/42]
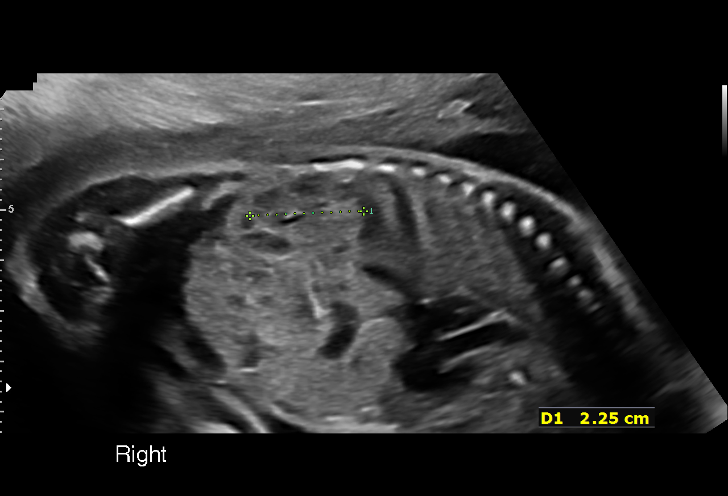
[im 42/42]
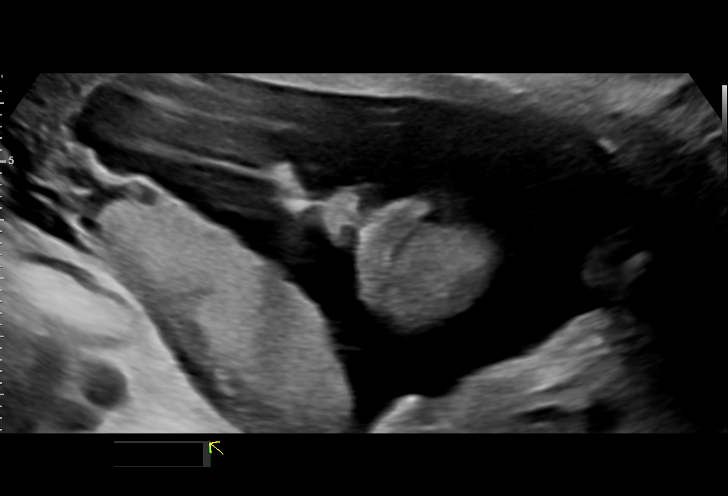

[14 of 28 positions shown; findings below may reference images not displayed]

SAYDE CNM

Indications

 Marginal insertion of umbilical cord affecting
 management of mother in second trimester
 24 weeks gestation of pregnancy
 Chronic Hepatitis C complicating pregnancy,
 antepartum (history of)
 Drug use complicating pregnancy, second
 trimester (history of)
Fetal Evaluation

 Num Of Fetuses:         1
 Fetal Heart Rate(bpm):  150
 Cardiac Activity:       Observed
 Presentation:           Cephalic
 Placenta:               Posterior
 P. Cord Insertion:      Marginal insertion

 Amniotic Fluid
 AFI FV:      Within normal limits

                             Largest Pocket(cm)

Biometry

 BPD:      56.9  mm     G. Age:  23w 3d         22  %    CI:        75.09   %    70 - 86
                                                         FL/HC:      19.5   %    18.7 -
 HC:      208.3  mm     G. Age:  22w 6d          5  %    HC/AC:      1.13        1.05 -
 AC:      184.4  mm     G. Age:  23w 2d         20  %    FL/BPD:     71.5   %    71 - 87
 FL:       40.7  mm     G. Age:  23w 1d         15  %    FL/AC:      22.1   %    20 - 24
 HUM:      39.8  mm     G. Age:  24w 2d         48  %
 LV:          5  mm
 Est. FW:     572  gm      1 lb 4 oz     13  %
Gestational Age

 LMP:           24w 0d        Date:  01/09/20                 EDD:   10/15/20
 U/S Today:     23w 1d                                        EDD:   10/21/20
 Best:          24w 0d     Det. By:  LMP  (01/09/20)          EDD:   10/15/20
Anatomy

 Cranium:               Appears normal         Aortic Arch:            Previously seen
 Cavum:                 Previously seen        Ductal Arch:            Previously seen
 Ventricles:            Appears normal         Diaphragm:              Appears normal
 Choroid Plexus:        Previously seen        Stomach:                Appears normal, left
                                                                       sided
 Cerebellum:            Previously seen        Abdomen:                Previously seen
 Posterior Fossa:       Previously seen        Abdominal Wall:         Previously seen
 Nuchal Fold:           Previously seen        Cord Vessels:           Previously seen
 Face:                  Orbits and profile     Kidneys:                Appear normal
                        previously seen
 Lips:                  Previously seen        Bladder:                Appears normal
 Thoracic:              Appears normal         Spine:                  Previously seen
 Heart:                 Previously seen        Upper Extremities:      Previously seen
 RVOT:                  Previously seen        Lower Extremities:      Previously seen
 LVOT:                  Previously seen

 Other:  SVC IVC prev visualized. 3vv/t prev visualized. Heels and 5th digit
         prev visualized. Open hands prev visualized. Nasal bone prev
         visualized.
Cervix Uterus Adnexa

 Cervix
 Length:           4.39  cm.
 Normal appearance by transabdominal scan.
Impression

 Marginal cord insertion.
 Fetal growth is appropriate for gestational age .Amniotic fluid
 is normal and good fetal activity is seen .
Recommendations

 -An appointment was made for her to return in 4 weeks for
 fetal growth assessment.
                 Bathromeus, Kleophas

## 2022-07-22 ENCOUNTER — Encounter: Payer: Self-pay | Admitting: Infectious Diseases

## 2022-07-22 ENCOUNTER — Ambulatory Visit (INDEPENDENT_AMBULATORY_CARE_PROVIDER_SITE_OTHER): Payer: Medicaid Other | Admitting: Infectious Diseases

## 2022-07-22 ENCOUNTER — Other Ambulatory Visit: Payer: Self-pay

## 2022-07-22 VITALS — BP 110/71 | HR 92 | Temp 98.1°F | Wt 201.0 lb

## 2022-07-22 DIAGNOSIS — Z79899 Other long term (current) drug therapy: Secondary | ICD-10-CM | POA: Insufficient documentation

## 2022-07-22 DIAGNOSIS — B182 Chronic viral hepatitis C: Secondary | ICD-10-CM | POA: Diagnosis present

## 2022-07-22 DIAGNOSIS — Z7185 Encounter for immunization safety counseling: Secondary | ICD-10-CM | POA: Diagnosis not present

## 2022-07-22 NOTE — Progress Notes (Addendum)
[facility]                                      [id number], [place], [place], [address]                                               Phn. [phone]; Fax: [phone]                                                               Date: 07/22/22 Reason for Visit: Hepatitis C Fu   HPI: Abigail Pittman is a 27 y.o.old female with PMH of IVDU, COVID 19  and chronic Hepatitis C who is referred from PCP for evaluation and management of Hepatitis C  Initially diagnosed at the age of 88- no prior evaluation and treatment done, HCV RNA has tested positive consistently in 2015 and 2022.   Denies h/o injectable IVDU but h/o drug use noted in chart, denies blood transfusion, possibly sharing of toothbrushes/razors during young age+. Her current fiance was also positive for Hepatitis C and spontaneously cleared. Incarcerated in 2018, 2019, 2021 which she tells is due to bad life choices. Father had h/o pancreatic ca and alcohol related liver cirrhosis. Has not received tx to date.   VAPES, alcohol occasionally and denies IVDU currently  Lives with fiance and has a 83 month old child.   Denies any hospitalizations related to liver disease, jaundice, ascites, GI bleeding, mental status changes, abdominal pain and acholic stool. She has no acute complaints today.   07/22/22 Started Dorita Fray 2/15 and has completed 3 months course, last dose is 5/10 she reports.  Denies missing doses. No issues while on tx.  She vapes. Denies IVDU or alcohol use. No complaints otherwise.   ROS: Denies yellowish discoloration of sclera and skin, abdominal pain/distension, hematemesis.            Denies fever, chills, nightsweats, nausea, vomiting, diarrhea, constipation, weight loss, recent hospitalizations, rashes, joint complaints, shortness of breath, chest pain, headaches, dysuria .  Current Outpatient Medications on File Prior to Visit   Medication Sig Dispense Refill   Sofosbuvir-Velpatasvir (EPCLUSA) 400-100 MG TABS Take 1 tablet by mouth daily. 28 tablet 2   No current facility-administered medications on file prior to visit.    Allergies  Allergen Reactions   Penicillins Hives    07/2020: took keflex okay?   Past Medical History:  Diagnosis Date   COVID-19 03/2020   Hepatitis C    History of drug use    Threatened miscarriage 02/17/2020   UTI (urinary tract infection)    Past Surgical History:  Procedure Laterality Date   SHOULDER SURGERY Left    Social History   Socioeconomic History   Marital status: Single    Spouse name: Not on file   Number of children: Not on file   Years of education: Not on file   Highest education level: Not on file  Occupational History   Not on file  Tobacco Use   Smoking status: Former  Packs/day: 1    Types: Cigarettes    Quit date: 02/09/2020    Years since quitting: 2.4   Smokeless tobacco: Never  Vaping Use   Vaping Use: Former   Quit date: 08/26/2019   Substances: Nicotine-salt  Substance and Sexual Activity   Alcohol use: Not Currently   Drug use: Not Currently    Types: Marijuana, Methamphetamines, Heroin    Comment: 03/27/20 6 months free   Sexual activity: Yes    Birth control/protection: None  Other Topics Concern   Not on file  Social History Narrative   Not on file   Social Determinants of Health   Financial Resource Strain: Not on file  Food Insecurity: No Food Insecurity (10/10/2020)   Hunger Vital Sign    Worried About Running Out of Food in the Last Year: Never true    Ran Out of Food in the Last Year: Never true  Transportation Needs: No Transportation Needs (10/10/2020)   PRAPARE - Administrator, Civil Service (Medical): No    Lack of Transportation (Non-Medical): No  Physical Activity: Not on file  Stress: Not on file  Social Connections: Not on file  Intimate Partner Violence: Not At Risk (04/02/2020)   Humiliation,  Afraid, Rape, and Kick questionnaire    Fear of Current or Ex-Partner: No    Emotionally Abused: No    Physically Abused: No    Sexually Abused: No   Family History  Problem Relation Age of Onset   Diverticulitis Mother    Pancreatic cancer Father    Skin cancer Father    Colon cancer Father    Vitals  BP 110/71   Pulse 92   Temp 98.1 F (36.7 C) (Oral)   Wt 201 lb (91.2 kg)   SpO2 96%   BMI 32.94 kg/m   Gen:  no acute distress HEENT: [place]/AT, no scleral icterus, no pale conjunctivae, hearing normal, oral mucosa moist Neck: Supple Cardio: Regular rate and rhythm Resp: Pulmonary effort normal on room air GI: Soft, nontender, nondistended GU: MSK - no pedal edema  Skin- no rashes  Neuro: Grossly non focal, awake, alert and oriented * 3 Psych: Calm, cooperative   Laboratory      Latest Ref Rng & Units 04/08/2022   10:16 AM 10/24/2020    5:29 AM 10/22/2020    5:19 PM  CBC  WBC 3.8 - 10.8 Thousand/uL 6.2  13.9  8.0   Hemoglobin 11.7 - 15.5 g/dL 16.1  09.6  04.5   Hematocrit 35.0 - 45.0 % 40.5  31.7  37.9   Platelets 140 - 400 Thousand/uL 257  224  259       Latest Ref Rng & Units 05/15/2022   10:55 AM 04/08/2022   10:16 AM 08/22/2020   11:06 AM  CMP  Glucose 65 - 99 mg/dL 61   70   BUN 7 - 25 mg/dL 14   10   Creatinine 4.09 - 0.96 mg/dL 8.11   9.14   Sodium 782 - 146 mmol/L 139   138   Potassium 3.5 - 5.3 mmol/L 4.3   4.4   Chloride 98 - 110 mmol/L 105   106   CO2 20 - 32 mmol/L 25   17   Calcium 8.6 - 10.2 mg/dL 9.4   8.6   Total Protein 6.1 - 8.1 g/dL 7.1  7.5  6.1   Total Bilirubin 0.2 - 1.2 mg/dL 0.4  0.4  0.4   Alkaline Phos 44 -  121 IU/L   108   AST 10 - 30 U/L 15  35  20   ALT 6 - 29 U/L 12  37    37  19     Assessment/Plan: # Chronic Hepatitis C -Started Epclusa 2/15, completed 12 weeks course -HCV RNA today at end of tx -Fu in 3 months for 12 weeks post tx SVR check   # Medication management  - 3/14 CMP unremarkable, HCV RNA <15  #  Immunization counseling  - Hep A # 2 6 months after 1 st dose   # Polysubstance use  - clean from IVDU but VAPEs  I have personally spent 35 minutes involved in face-to-face and non-face-to-face activities for this patient on the day of the visit. Professional time spent includes the following activities: Preparing to see the patient (review of tests), Obtaining and/or reviewing separately obtained history (admission/discharge record), Performing a medically appropriate examination and/or evaluation , Ordering medications/tests/procedures, referring and communicating with other health care professionals, Documenting clinical information in the EMR, Independently interpreting results (not separately reported), Communicating results to the patient/family/caregiver, Counseling and educating the patient/family/caregiver and Care coordination (not separately reported).   Patients questions were addressed and answered.   Electronically signed by:  Odette Fraction, MD Infectious Diseases  Office phone [phone] Fax no. [phone]

## 2022-07-25 LAB — HEPATITIS C RNA QUANTITATIVE
HCV Quantitative Log: <1.18 log IU/mL
HCV RNA, PCR, QN: <15 IU/mL

## 2022-08-19 IMAGING — US US MFM OB FOLLOW-UP
1 series · 14 of 28 positions shown · non-contrast
Comparison: none

[Series 1: us mfm ob follow-up · 14 of 31 slices shown]
[im 2/31]
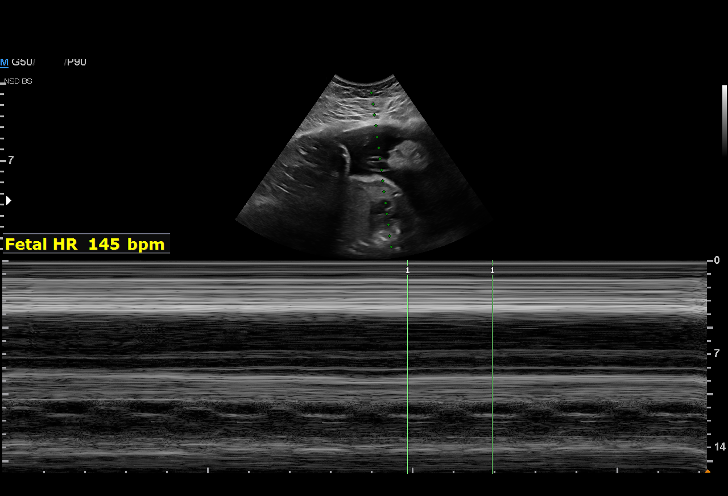
[im 4/31]
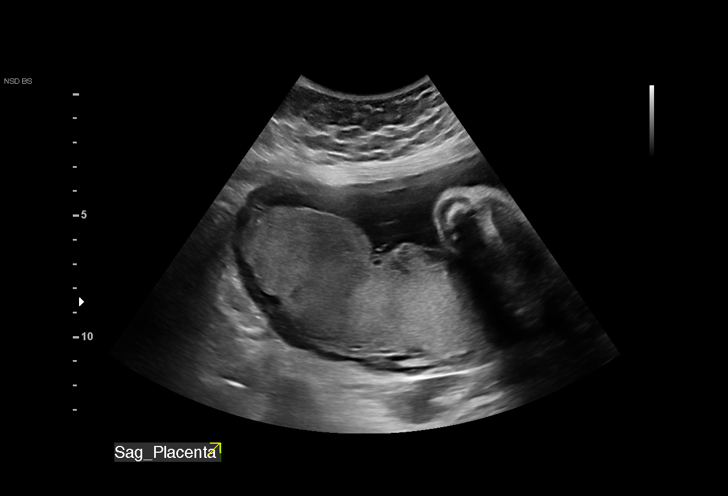
[im 6/31]
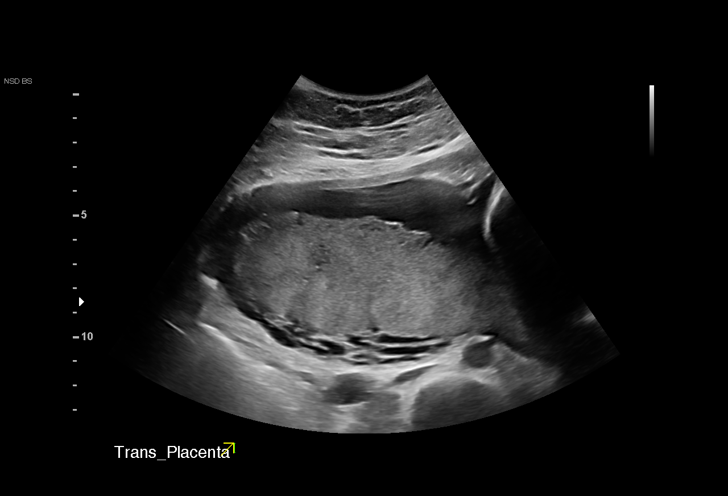
[im 8/31]
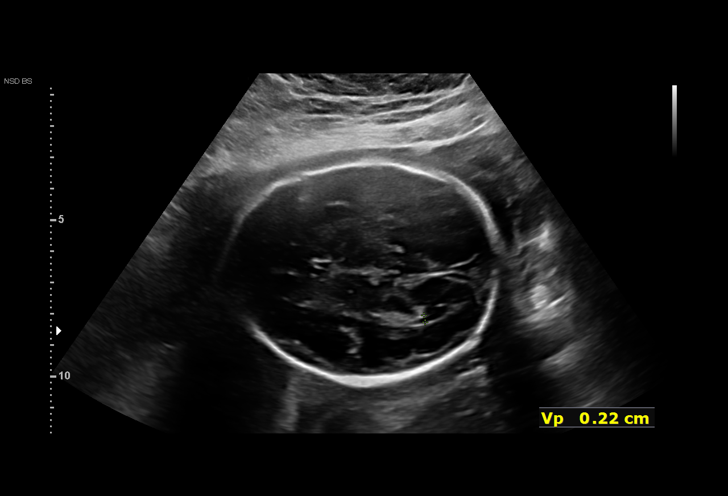
[im 11/31]
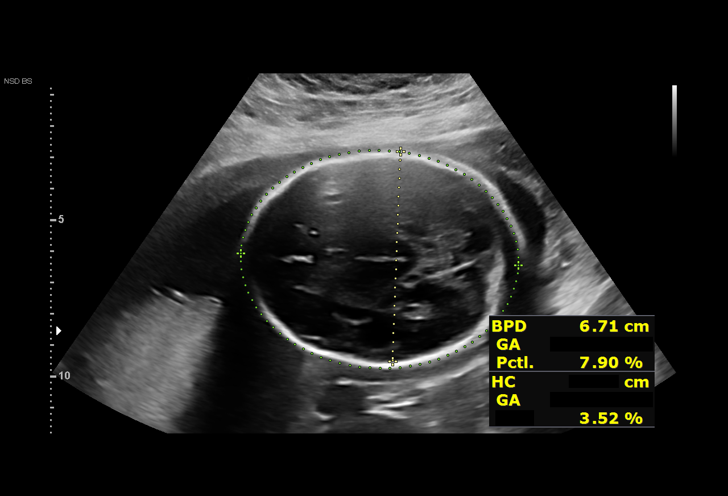
[im 13/31]
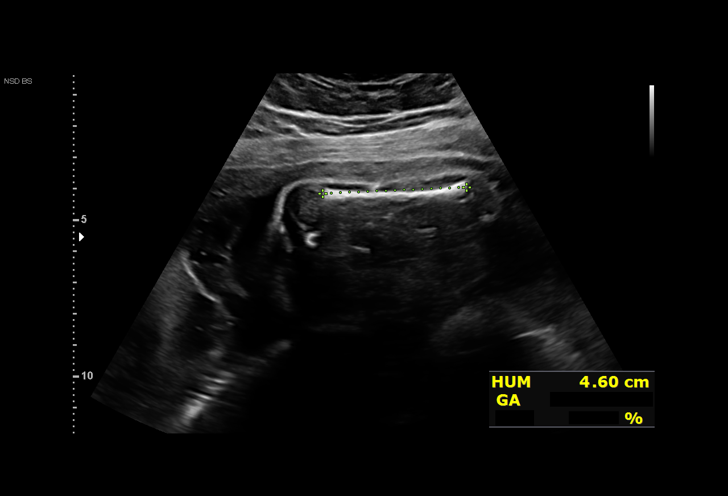
[im 15/31]
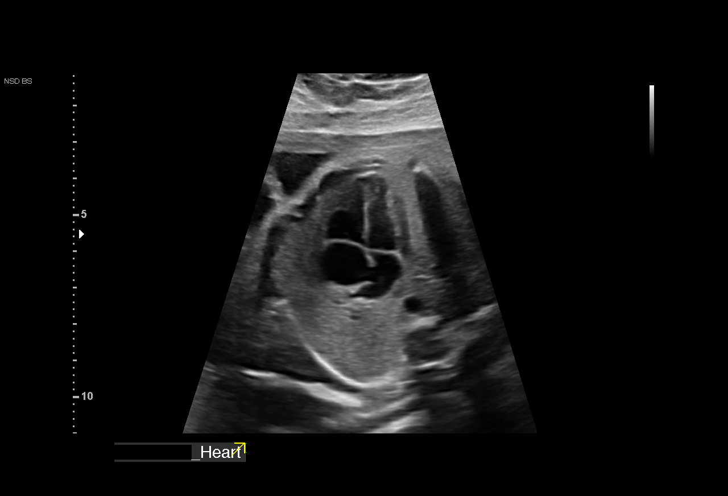
[im 17/31]
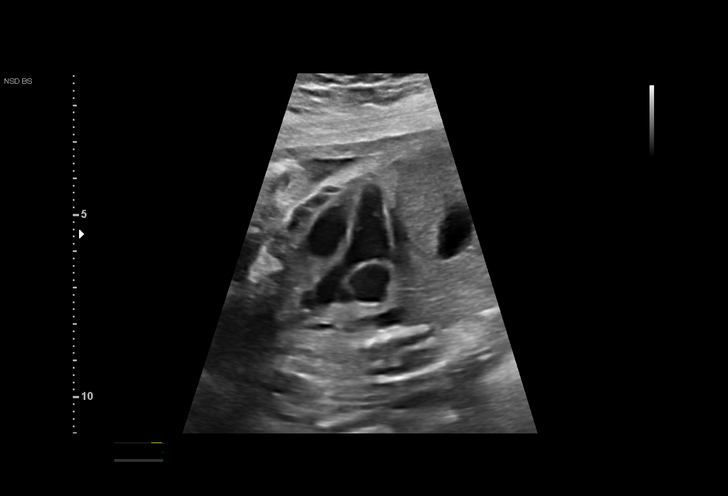
[im 19/31]
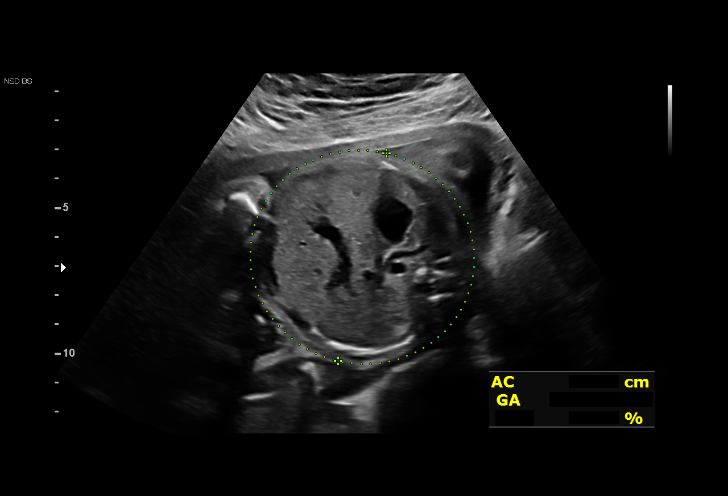
[im 22/31]
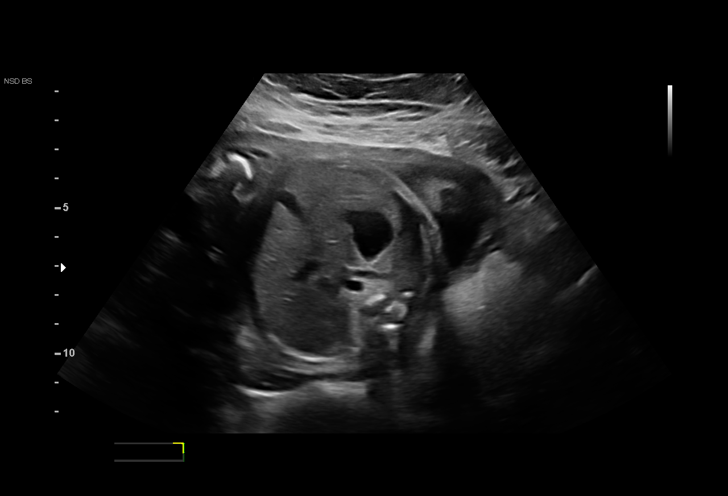
[im 24/31]
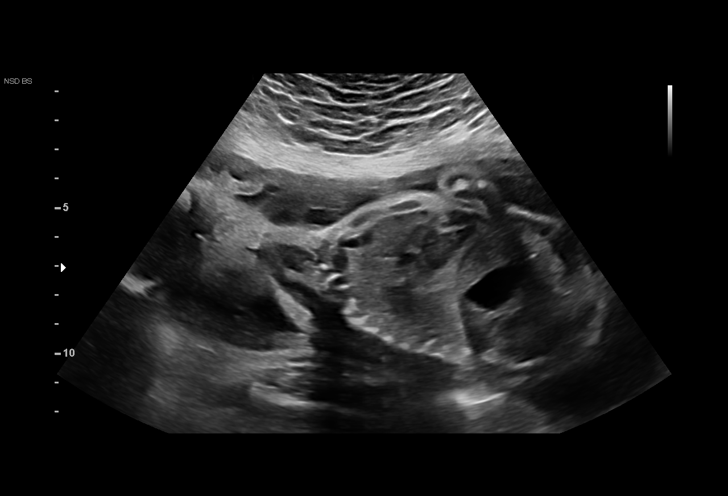
[im 26/31]
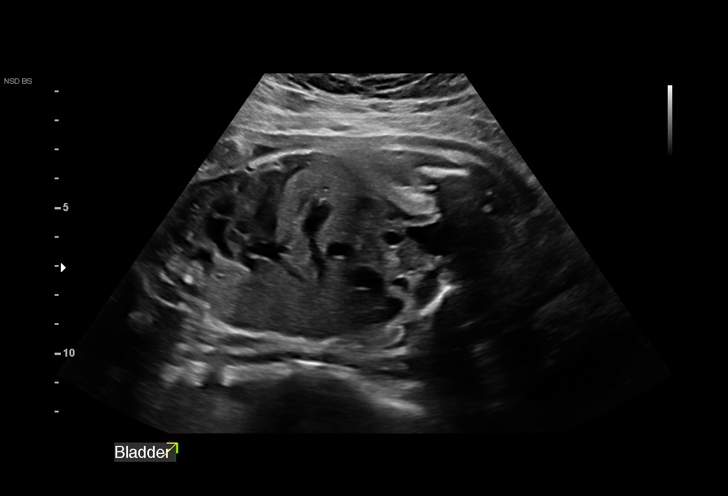
[im 28/31]
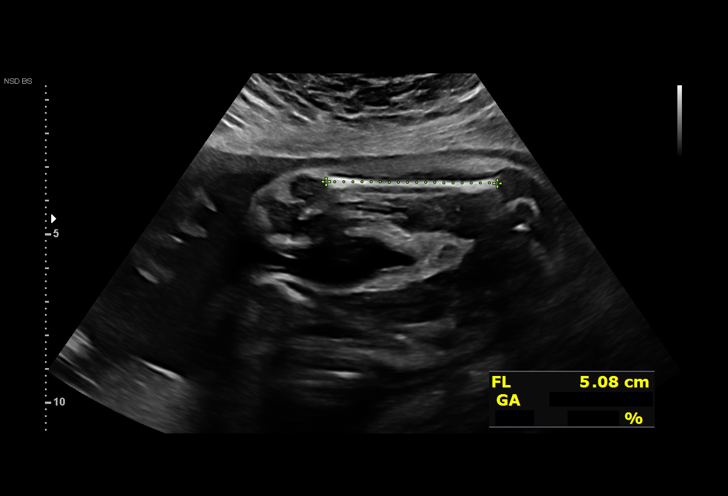
[im 31/31]
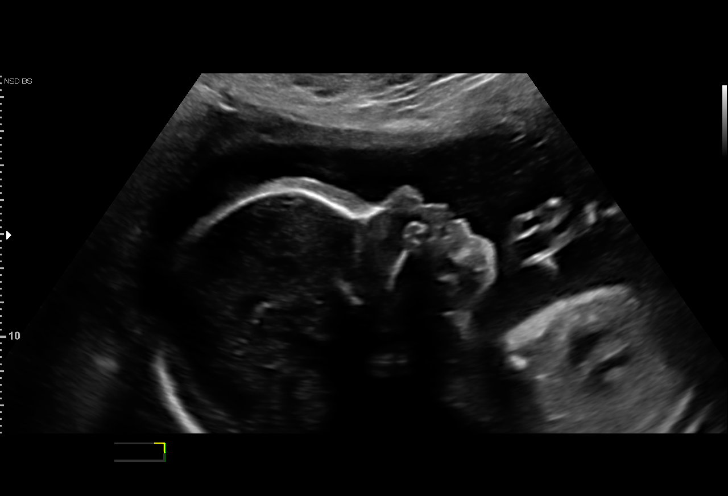

[14 of 28 positions shown; findings below may reference images not displayed]

TY CNM

Indications

 Marginal insertion of umbilical cord affecting
 management of mother in second trimester
 28 weeks gestation of pregnancy
 Chronic Hepatitis C complicating pregnancy,
 antepartum (history of) Baseline labs drawn
 07-25-20
 Drug use complicating pregnancy, second
 trimester (history of)
 Negative AFP (05-21-20)
Fetal Evaluation

 Num Of Fetuses:         1
 Fetal Heart Rate(bpm):  145
 Cardiac Activity:       Observed
 Presentation:           Transverse, head to maternal left
 Placenta:               Posterior
 P. Cord Insertion:      Marginal insertion

 Amniotic Fluid
 AFI FV:      Within normal limits

 AFI Sum(cm)     %Tile       Largest Pocket(cm)
 19.09           74

 RUQ(cm)       RLQ(cm)       LUQ(cm)        LLQ(cm)

Biometry
 BPD:      67.3  mm     G. Age:  27w 1d          9  %    CI:         72.8   %    70 - 86
                                                         FL/HC:      20.4   %    18.8 -
 HC:      250.8  mm     G. Age:  27w 2d        3.6  %    HC/AC:      1.06        1.05 -
 AC:      236.9  mm     G. Age:  28w 0d         34  %    FL/BPD:     76.1   %    71 - 87
 FL:       51.2  mm     G. Age:  27w 3d         14  %    FL/AC:      21.6   %    20 - 24
 HUM:      46.3  mm     G. Age:  27w 2d         24  %
 LV:        2.2  mm

 Est. FW:    3377  gm      2 lb 7 oz     18  %
Gestational Age

 LMP:           28w 2d        Date:  01/09/20                 EDD:   10/15/20
 U/S Today:     27w 3d                                        EDD:   10/21/20
 Best:          28w 2d     Det. By:  LMP  (01/09/20)          EDD:   10/15/20
Anatomy

 Cranium:               Appears normal         LVOT:                   Appears normal
 Cavum:                 Previously seen        Aortic Arch:            Previously seen
 Ventricles:            Appears normal         Ductal Arch:            Previously seen
 Choroid Plexus:        Previously seen        Diaphragm:              Appears normal
 Cerebellum:            Previously seen        Stomach:                Appears normal, left
                                                                       sided
 Posterior Fossa:       Previously seen        Abdomen:                Previously seen
 Nuchal Fold:           Previously seen        Abdominal Wall:         Previously seen
 Face:                  Orbits and profile     Cord Vessels:           Previously seen
                        previously seen
 Lips:                  Previously seen        Kidneys:                Appear normal
 Palate:                Not well visualized    Bladder:                Appears normal
 Thoracic:              Appears normal         Spine:                  Previously seen
 Heart:                 Appears normal         Upper Extremities:      Previously seen
                        (4CH, axis, and
                        situs)
 RVOT:                  Appears normal         Lower Extremities:      Previously seen

 Other:  SVC IVC prev visualized. 3vv/t prev visualized. Heels and 5th digit
         prev visualized. Open hands prev visualized. Nasal bone prev
         visualized.
Cervix Uterus Adnexa

 Cervix
 Not visualized (advanced GA >26wks)

 Right Ovary
 Previously seen

 Left Ovary
 Previously seen.
Impression

 Follow up growth due to marginal cord insertion seen on the
 prior visit.
 Normal interval growth with measurements consistent with
 dates
 Good fetal movement and amniotic fluid volume
Recommendations

 Follow up growth is scheduled in 4-6 weeks.

## 2022-10-16 ENCOUNTER — Ambulatory Visit (INDEPENDENT_AMBULATORY_CARE_PROVIDER_SITE_OTHER): Payer: MEDICAID | Admitting: Infectious Diseases

## 2022-10-16 ENCOUNTER — Encounter: Payer: Self-pay | Admitting: Infectious Diseases

## 2022-10-16 ENCOUNTER — Other Ambulatory Visit: Payer: Self-pay

## 2022-10-16 VITALS — BP 101/68 | HR 75 | Resp 16 | Ht 65.5 in | Wt 186.0 lb

## 2022-10-16 DIAGNOSIS — B182 Chronic viral hepatitis C: Secondary | ICD-10-CM

## 2022-10-16 DIAGNOSIS — Z0000 Encounter for general adult medical examination without abnormal findings: Secondary | ICD-10-CM

## 2022-10-16 DIAGNOSIS — Z7185 Encounter for immunization safety counseling: Secondary | ICD-10-CM

## 2022-10-16 NOTE — Progress Notes (Signed)
[facility]                                      [address], [place], [place], [address]                                               Phn. [phone]; Fax: [phone]                                                               Date: 10/16/22 Reason for Visit: Hepatitis C Fu   HPI: Abigail Pittman is a 27 y.o.old female with PMH of IVDU, COVID 19  and chronic Hepatitis C who is referred from PCP for evaluation and management of Hepatitis C  Initially diagnosed at the age of 31- no prior evaluation and treatment done, HCV RNA has tested positive consistently in 2015 and 2022.   Denies h/o injectable IVDU but h/o drug use noted in chart, denies blood transfusion, possibly sharing of toothbrushes/razors during young age+. Her current fiance was also positive for Hepatitis C and spontaneously cleared. Incarcerated in 2018, 2019, 2021 which she tells is due to bad life choices. Father had h/o pancreatic ca and alcohol related liver cirrhosis. Has not received tx to date.   VAPES, alcohol occasionally and denies IVDU currently  Lives with fiance and has a 75 month old child.   Denies any hospitalizations related to liver disease, jaundice, ascites, GI bleeding, mental status changes, abdominal pain and acholic stool. She has no acute complaints today.   Started Dorita Fray 2/15 and has completed 3 months course, last dose is 5/10 she reports.   10/16/22 She is here for her 12 weeks post treatment SVR check. She has no other complaints and continues to stay off IVD. She is willing to get her HAV # 2 in a month.   ROS: Denies fevers, chills. Denies nausea, vomiting and diarrhea.   Current Outpatient Medications on File Prior to Visit  Medication Sig Dispense Refill   levonorgestrel (KYLEENA) 19.5 MG IUD 1 each by Intrauterine route once.     No current facility-administered medications on file prior to visit.     Allergies  Allergen Reactions   Penicillins Hives    07/2020: took keflex okay?   Past Medical History:  Diagnosis Date   COVID-19 03/2020   Hepatitis C    History of drug use    Threatened miscarriage 02/17/2020   UTI (urinary tract infection)    Past Surgical History:  Procedure Laterality Date   SHOULDER SURGERY Left    Social History   Socioeconomic History   Marital status: Single    Spouse name: Not on file   Number of children: Not on file   Years of education: Not on file   Highest education level: Not on file  Occupational History   Not on file  Tobacco Use   Smoking status: Former    Current packs/day: 0.00    Types: Cigarettes    Quit date: 02/09/2020    Years since quitting:  2.6   Smokeless tobacco: Never  Vaping Use   Vaping status: Former   Quit date: 08/26/2019   Substances: Nicotine-salt  Substance and Sexual Activity   Alcohol use: Not Currently   Drug use: Not Currently    Types: Marijuana, Methamphetamines, Heroin    Comment: 03/27/20 6 months free   Sexual activity: Yes    Birth control/protection: None  Other Topics Concern   Not on file  Social History Narrative   Not on file   Social Determinants of Health   Financial Resource Strain: Not on file  Food Insecurity: Low Risk  (08/20/2022)   Received from [facility]   Food vital sign    Within the past 12 months, you worried that your food would run out before you got money to buy more: Never true    Within the past 12 months, the food you bought just didn't last and you didn't have money to get more. : Never true  Transportation Needs: Not on file (08/20/2022)  Physical Activity: Not on file  Stress: Not on file  Social Connections: Not on file  Intimate Partner Violence: Not At Risk (04/02/2020)   Humiliation, Afraid, Rape, and Kick questionnaire    Fear of Current or Ex-Partner: No    Emotionally Abused: No    Physically Abused: No    Sexually Abused: No   Family History   Problem Relation Age of Onset   Diverticulitis Mother    Pancreatic cancer Father    Skin cancer Father    Colon cancer Father    Vitals  BP 101/68   Pulse 75   Resp 16   Ht 5' 5.5" (1.664 m)   Wt 186 lb (84.4 kg)   LMP  (LMP Unknown)   SpO2 98%   Breastfeeding No   BMI 30.48 kg/m    Gen:  no acute distress HEENT: [place]/AT, no scleral icterus, no pale conjunctivae, hearing normal, oral mucosa moist Neck: Supple Cardio: Regular rate and rhythm Resp: Pulmonary effort normal on room air GI: nondistended GU: MSK - no pedal edema  Skin- no rashes  Neuro: Grossly non focal, awake, alert and oriented * 3 Psych: Calm, cooperative   Laboratory      Latest Ref Rng & Units 04/08/2022   10:16 AM 10/24/2020    5:29 AM 10/22/2020    5:19 PM  CBC  WBC 3.8 - 10.8 Thousand/uL 6.2  13.9  8.0   Hemoglobin 11.7 - 15.5 g/dL 21.3  08.6  57.8   Hematocrit 35.0 - 45.0 % 40.5  31.7  37.9   Platelets 140 - 400 Thousand/uL 257  224  259       Latest Ref Rng & Units 05/15/2022   10:55 AM 04/08/2022   10:16 AM 08/22/2020   11:06 AM  CMP  Glucose 65 - 99 mg/dL 61   70   BUN 7 - 25 mg/dL 14   10   Creatinine 4.69 - 0.96 mg/dL 6.29   5.28   Sodium 413 - 146 mmol/L 139   138   Potassium 3.5 - 5.3 mmol/L 4.3   4.4   Chloride 98 - 110 mmol/L 105   106   CO2 20 - 32 mmol/L 25   17   Calcium 8.6 - 10.2 mg/dL 9.4   8.6   Total Protein 6.1 - 8.1 g/dL 7.1  7.5  6.1   Total Bilirubin 0.2 - 1.2 mg/dL 0.4  0.4  0.4   Alkaline Phos  44 - 121 IU/L   108   AST 10 - 30 U/L 15  35  20   ALT 6 - 29 U/L 12  37    37  19     Assessment/Plan: # Chronic Hepatitis C -Started Epclusa 2/15, completed 12 weeks course -07/22/22 HCV RNA post tx negative  -HCV RNA today for post tx 12 weeks SVR check   # Immunization counseling  - Hep A # 2 dose in 9/17  # Polysubstance use/health care maintenance - clean from IVDU but VAPEs - discussed about chances of recurrence of infection in case of engagement in high  risk activities   I have personally spent 40 minutes involved in face-to-face and non-face-to-face activities for this patient on the day of the visit. Professional time spent includes the following activities: Preparing to see the patient (review of tests), Obtaining and/or reviewing separately obtained history (admission/discharge record), Performing a medically appropriate examination and/or evaluation , Ordering medications/tests/procedures, referring and communicating with other health care professionals, Documenting clinical information in the EMR, Independently interpreting results (not separately reported), Communicating results to the patient/family/caregiver, Counseling and educating the patient/family/caregiver and Care coordination (not separately reported).   Patients questions were addressed and answered.   Electronically signed by:  Odette Fraction, MD Infectious Diseases  Office phone [phone] Fax no. [phone]

## 2022-10-19 LAB — HEPATITIS C RNA QUANTITATIVE
HCV Quantitative Log: <1.18 {Log_IU}/mL
HCV RNA, PCR, QN: <15 [IU]/mL

## 2022-10-20 DIAGNOSIS — Z0000 Encounter for general adult medical examination without abnormal findings: Secondary | ICD-10-CM | POA: Insufficient documentation

## 2022-10-22 ENCOUNTER — Telehealth: Payer: Self-pay

## 2022-10-22 NOTE — Telephone Encounter (Signed)
Called patient to inform her that hep c labs are negative. No follow up needed with office. Verbalized understanding. Juanita Laster, RMA

## 2022-10-22 NOTE — Telephone Encounter (Signed)
-----   Message from Victoriano Lain sent at 10/22/2022 12:23 PM EDT ----- Please let her know HCV RNA is negative which she is cured of Hepatitis C infection.

## 2022-11-18 ENCOUNTER — Ambulatory Visit (INDEPENDENT_AMBULATORY_CARE_PROVIDER_SITE_OTHER): Payer: MEDICAID

## 2022-11-18 ENCOUNTER — Other Ambulatory Visit: Payer: Self-pay

## 2022-11-18 DIAGNOSIS — Z23 Encounter for immunization: Secondary | ICD-10-CM

## 2024-03-24 ENCOUNTER — Ambulatory Visit
Admission: RE | Admit: 2024-03-24 | Discharge: 2024-03-24 | Disposition: A | Discharge status: Home / Self Care | Payer: MEDICAID | Source: Ambulatory Visit | Attending: Family Medicine | Admitting: Family Medicine

## 2024-03-24 VITALS — BP 115/70 | HR 92 | Temp 97.9°F | Resp 18 | Ht 65.5 in | Wt 200.0 lb

## 2024-03-24 DIAGNOSIS — J988 Other specified respiratory disorders: Secondary | ICD-10-CM | POA: Diagnosis not present

## 2024-03-24 DIAGNOSIS — B9789 Other viral agents as the cause of diseases classified elsewhere: Secondary | ICD-10-CM | POA: Diagnosis not present

## 2024-03-24 MED ORDER — CETIRIZINE HCL 10 MG PO TABS: 10.0000 mg | ORAL_TABLET | Freq: Every day | ORAL | 0 refills | Status: AC

## 2024-03-24 MED ORDER — PSEUDOEPHEDRINE HCL 30 MG PO TABS: 30.0000 mg | ORAL_TABLET | Freq: Three times a day (TID) | ORAL | 0 refills | Status: AC | PRN

## 2024-03-24 MED ORDER — PROMETHAZINE-DM 6.25-15 MG/5ML PO SYRP: 5.0000 mL | ORAL_SOLUTION | Freq: Three times a day (TID) | ORAL | 0 refills | Status: AC | PRN

## 2024-03-24 MED ORDER — IBUPROFEN 600 MG PO TABS: 600.0000 mg | ORAL_TABLET | Freq: Four times a day (QID) | ORAL | 0 refills | Status: AC | PRN

## 2024-03-24 NOTE — ED Triage Notes (Signed)
 Pt states that she has body aches, loss of voice, cough and chest pain. X3-4 days

## 2024-03-24 NOTE — ED Provider Notes (Signed)
 " [facility]  Note:  This document was prepared using Conservation officer, historic buildings and may include unintentional dictation errors.  MRN: [id number] DOB: 02/02/1996  Subjective:   Abigail Pittman is a 29 y.o. female presenting for 3 to 4-day history of sinus congestion, productive cough, hoarseness, body pains, malaise and fatigue.  No asthma.  Vapes regularly.  Did at home respiratory tests and were negative.  Current Outpatient Medications  Medication Instructions   desvenlafaxine (PRISTIQ) 25 mg, Daily   levonorgestrel  (KYLEENA ) 19.5 MG IUD 1 each, Intrauterine,  Once   SPRINTEC 28 0.25-35 MG-MCG tablet 1 tablet, Daily    Allergies[1]  Past Medical History:  Diagnosis Date   COVID-19 03/2020   Hepatitis C    History of drug use    Threatened miscarriage 02/17/2020   UTI (urinary tract infection)      Past Surgical History:  Procedure Laterality Date   SHOULDER SURGERY Left     Family History  Problem Relation Age of Onset   Diverticulitis Mother    Pancreatic cancer Father    Skin cancer Father    Colon cancer Father     Social History   Occupational History   Not on file  Tobacco Use   Smoking status: Former    Current packs/day: 0.00    Average packs/day: 1.0 packs/day    Types: Cigarettes    Quit date: 02/09/2020    Years since quitting: 4.1   Smokeless tobacco: Never  Vaping Use   Vaping status: Every Day   Last attempt to quit: 08/26/2019   Substances: Nicotine-salt  Substance and Sexual Activity   Alcohol use: Not Currently   Drug use: Not Currently    Types: Marijuana, Methamphetamines, Heroin    Comment: 03/27/20 6 months free   Sexual activity: Yes    Birth control/protection: None     ROS   Objective:   Vitals: BP 115/70 (BP Location: Right Arm)   Pulse 92   Temp 97.9 F (36.6 C) (Oral)   Resp 18   Ht 5' 5.5 (1.664 m)   Wt 200 lb (90.7 kg)   LMP 03/14/2024   SpO2 95%   BMI 32.78 kg/m    Physical Exam Constitutional:      General: She is not in acute distress.    Appearance: Normal appearance. She is well-developed and normal weight. She is not ill-appearing, toxic-appearing or diaphoretic.  HENT:     Head: Normocephalic and atraumatic.     Right Ear: Tympanic membrane, ear canal and external ear normal. No drainage or tenderness. No middle ear effusion. There is no impacted cerumen. Tympanic membrane is not erythematous or bulging.     Left Ear: Tympanic membrane, ear canal and external ear normal. No drainage or tenderness.  No middle ear effusion. There is no impacted cerumen. Tympanic membrane is not erythematous or bulging.     Nose: Nose normal. No congestion or rhinorrhea.     Mouth/Throat:     Mouth: Mucous membranes are moist. No oral lesions.     Pharynx: No pharyngeal swelling, oropharyngeal exudate, posterior oropharyngeal erythema or uvula swelling.     Tonsils: No tonsillar exudate or tonsillar abscesses.  Eyes:     General: No scleral icterus.       Right eye: No discharge.        Left eye: No discharge.     Extraocular Movements: Extraocular movements intact.     Right eye: Normal extraocular  motion.     Left eye: Normal extraocular motion.     Conjunctiva/sclera: Conjunctivae normal.  Cardiovascular:     Rate and Rhythm: Normal rate and regular rhythm.     Heart sounds: Normal heart sounds. No murmur heard.    No friction rub. No gallop.  Pulmonary:     Effort: Pulmonary effort is normal. No respiratory distress.     Breath sounds: No stridor. No wheezing, rhonchi or rales.  Chest:     Chest wall: No tenderness.  Musculoskeletal:     Cervical back: Normal range of motion and neck supple.  Lymphadenopathy:     Cervical: No cervical adenopathy.  Skin:    General: Skin is warm and dry.  Neurological:     General: No focal deficit present.     Mental Status: She is alert and oriented to person, place, and time.  Psychiatric:        Mood and  Affect: Mood normal.        Behavior: Behavior normal.     Assessment and Plan :   PDMP not reviewed this encounter.  1. Viral respiratory infection      Deferred imaging given clear pulmonary exam.  Suspect viral URI, viral syndrome. Physical exam findings reassuring and vital signs stable for discharge. Advised supportive care, offered symptomatic relief. Counseled patient on potential for adverse effects with medications prescribed/recommended today, ER and return-to-clinic precautions discussed, patient verbalized understanding.      [1]  Allergies Allergen Reactions   Penicillins Hives    07/2020: took keflex  okay?     Christopher Savannah, [place] 03/24/24 1111  "

## 2024-03-24 NOTE — Discharge Instructions (Signed)
 We will manage this as a viral illness. For sore throat or cough try using a honey-based tea. Use 3 teaspoons of honey with juice squeezed from half lemon. Place shaved pieces of ginger into 1/2-1 cup of water and warm over stove top. Then mix the ingredients and repeat every 4 hours as needed. Please take ibuprofen 600mg  every 6 hours with food alternating with OR taken together with Tylenol 500mg -650mg  every 6 hours for throat pain, fevers, aches and pains. Hydrate very well with at least 2 liters of water. Eat light meals such as soups (chicken and noodles, vegetable, chicken and wild rice).  Do not eat foods that you are allergic to.  Taking an antihistamine like Zyrtec (10mg  daily) can help against postnasal drainage, sinus congestion which can cause sinus pain, sinus headaches, throat pain, painful swallowing, coughing.  You can take this together with pseudoephedrine (Sudafed) at a dose of 30mg  3 times a day or twice daily as needed for the same kind of nasal drip, congestion.
